# Patient Record
Sex: Male | Born: 1990 | Race: Black or African American | Hispanic: No | Marital: Single | Smoking: Never smoker
Health system: Southern US, Community
[De-identification: ages and names within clinical notes are randomized; demographics above are authoritative.]

## PROBLEM LIST (undated history)

## (undated) DIAGNOSIS — R569 Unspecified convulsions: Secondary | ICD-10-CM

## (undated) DIAGNOSIS — Z9911 Dependence on respirator [ventilator] status: Secondary | ICD-10-CM

## (undated) DIAGNOSIS — G9340 Encephalopathy, unspecified: Secondary | ICD-10-CM

## (undated) HISTORY — PX: GASTROSTOMY: SHX151

## (undated) HISTORY — PX: TRACHEOSTOMY: SUR1362

---
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
## 2011-05-05 LAB — POCT I-STAT 3, ART BLOOD GAS (G3+)
Acid-Base Excess: 4 — ABNORMAL HIGH
O2 Saturation: 99
Operator id: 244851
Patient temperature: 98.6

## 2011-05-05 LAB — I-STAT 8, (EC8 V) (CONVERTED LAB)
Bicarbonate: 25.3 — ABNORMAL HIGH
Glucose, Bld: 89
TCO2: 27
pH, Ven: 7.357 — ABNORMAL HIGH

## 2011-05-05 LAB — CBC
HCT: 36.7
MCV: 79.6
Platelets: 167
RDW: 14

## 2011-05-05 LAB — GRAM STAIN

## 2011-05-05 LAB — DIFFERENTIAL
Basophils Absolute: 0
Basophils Relative: 0
Eosinophils Absolute: 0.2
Eosinophils Relative: 6 — ABNORMAL HIGH

## 2011-05-05 LAB — INFLUENZA A+B VIRUS AG-DIRECT(RAPID)

## 2011-05-05 LAB — POCT I-STAT CREATININE: Operator id: 295021

## 2011-05-05 LAB — CULTURE, RESPIRATORY W GRAM STAIN

## 2011-05-08 LAB — COMPREHENSIVE METABOLIC PANEL
Albumin: 3.5
BUN: 7
CO2: 26
Chloride: 102
Creatinine, Ser: <0.3 — ABNORMAL LOW
Glucose, Bld: 100 — ABNORMAL HIGH
Total Bilirubin: 0.4

## 2011-05-08 LAB — CBC
HCT: 36.3
Hemoglobin: 12
RDW: 14.7

## 2011-05-08 LAB — TSH: TSH: 2.547

## 2011-05-08 LAB — DIFFERENTIAL
Basophils Absolute: 0
Eosinophils Relative: 6 — ABNORMAL HIGH
Lymphocytes Relative: 25 — ABNORMAL LOW
Monocytes Absolute: 0.6

## 2011-05-08 LAB — GRAM STAIN

## 2011-05-08 LAB — POCT I-STAT EG7
Calcium, Ion: 1.29
HCT: 36
Hemoglobin: 12.2
Operator id: 148741
Potassium: 3.7
Sodium: 135
pH, Ven: 7.387 — ABNORMAL HIGH

## 2011-05-08 LAB — CULTURE, RESPIRATORY W GRAM STAIN

## 2011-05-12 LAB — URINALYSIS, ROUTINE W REFLEX MICROSCOPIC
Glucose, UA: NEGATIVE
Hgb urine dipstick: NEGATIVE
Ketones, ur: NEGATIVE
Protein, ur: NEGATIVE

## 2011-05-12 LAB — CALCIUM: Calcium: 9.3

## 2011-05-12 LAB — CBC
Hemoglobin: 13.4
RBC: 5.1
WBC: 4.7

## 2011-05-12 LAB — URINE CULTURE: Culture: NO GROWTH

## 2011-05-12 LAB — PHOSPHORUS: Phosphorus: 4.7 — ABNORMAL HIGH

## 2011-05-12 LAB — COMPREHENSIVE METABOLIC PANEL
AST: 36
Alkaline Phosphatase: 199
BUN: 5 — ABNORMAL LOW
CO2: 24
Chloride: 101
Creatinine, Ser: <0.3 — ABNORMAL LOW
Potassium: 4.2
Total Bilirubin: 0.6

## 2011-05-12 LAB — CULTURE, BLOOD (ROUTINE X 2)

## 2011-05-12 LAB — BASIC METABOLIC PANEL
Potassium: 4.3
Sodium: 134 — ABNORMAL LOW

## 2011-05-12 LAB — PHENYTOIN LEVEL, TOTAL: Phenytoin Lvl: 12.4

## 2011-05-12 LAB — T4: T4, Total: 6.6

## 2011-05-12 LAB — MAGNESIUM: Magnesium: 2.4

## 2011-05-19 LAB — DIFFERENTIAL
Basophils Absolute: 0 10*3/uL (ref 0.0–0.1)
Eosinophils Absolute: 0.1 10*3/uL (ref 0.0–1.2)
Eosinophils Relative: 5 % (ref 0–5)
Monocytes Absolute: 0.4 10*3/uL (ref 0.2–1.2)
Neutrophils Relative %: 48 % (ref 43–71)

## 2011-05-19 LAB — CULTURE, RESPIRATORY W GRAM STAIN

## 2011-05-19 LAB — COMPREHENSIVE METABOLIC PANEL
ALT: 53 U/L (ref 0–53)
CO2: 21 mEq/L (ref 19–32)
Calcium: 9.1 mg/dL (ref 8.4–10.5)
Glucose, Bld: 111 mg/dL — ABNORMAL HIGH (ref 70–99)
Sodium: 135 mEq/L (ref 135–145)

## 2011-05-19 LAB — CULTURE, BLOOD (ROUTINE X 2)

## 2011-05-19 LAB — URINALYSIS, ROUTINE W REFLEX MICROSCOPIC
Protein, ur: NEGATIVE mg/dL
Urobilinogen, UA: 0.2 mg/dL (ref 0.0–1.0)

## 2011-05-19 LAB — CBC
Hemoglobin: 12.1 g/dL (ref 12.0–16.0)
MCHC: 32.8 g/dL (ref 31.0–37.0)
MCV: 78.7 fL (ref 78.0–98.0)
RBC: 4.69 MIL/uL (ref 3.80–5.70)

## 2011-05-19 LAB — GRAM STAIN

## 2011-05-19 LAB — URINE CULTURE: Colony Count: 1000

## 2011-05-19 LAB — BUN: BUN: 7 mg/dL (ref 6–23)

## 2011-09-18 ENCOUNTER — Emergency Department (HOSPITAL_COMMUNITY)
Admission: EM | Admit: 2011-09-18 | Discharge: 2011-09-18 | Disposition: A | Discharge status: Home / Self Care | Payer: Medicaid Other | Attending: Emergency Medicine | Admitting: Emergency Medicine

## 2011-09-18 ENCOUNTER — Encounter (HOSPITAL_COMMUNITY): Payer: Self-pay | Admitting: *Deleted

## 2011-09-18 ENCOUNTER — Emergency Department (HOSPITAL_COMMUNITY): Payer: Medicaid Other

## 2011-09-18 DIAGNOSIS — R000 Tachycardia, unspecified: Secondary | ICD-10-CM | POA: Insufficient documentation

## 2011-09-18 DIAGNOSIS — Z930 Tracheostomy status: Secondary | ICD-10-CM | POA: Insufficient documentation

## 2011-09-18 DIAGNOSIS — J40 Bronchitis, not specified as acute or chronic: Secondary | ICD-10-CM | POA: Insufficient documentation

## 2011-09-18 DIAGNOSIS — Z9911 Dependence on respirator [ventilator] status: Secondary | ICD-10-CM | POA: Insufficient documentation

## 2011-09-18 DIAGNOSIS — R569 Unspecified convulsions: Secondary | ICD-10-CM | POA: Insufficient documentation

## 2011-09-18 HISTORY — DX: Dependence on respirator (ventilator) status: Z99.11

## 2011-09-18 HISTORY — DX: Unspecified convulsions: R56.9

## 2011-09-18 HISTORY — DX: Encephalopathy, unspecified: G93.40

## 2011-09-18 LAB — BASIC METABOLIC PANEL
CO2: 23 mEq/L (ref 19–32)
Chloride: 98 mEq/L (ref 96–112)
Creatinine, Ser: 0.24 mg/dL — ABNORMAL LOW (ref 0.50–1.35)
GFR calc Af Amer: >90 mL/min (ref >90)
Potassium: 3.6 mEq/L (ref 3.5–5.1)
Sodium: 133 mEq/L — ABNORMAL LOW (ref 135–145)

## 2011-09-18 LAB — CBC
MCHC: 33.2 g/dL (ref 30.0–36.0)
Platelets: 140 10*3/uL — ABNORMAL LOW (ref 150–400)
RDW: 14.3 % (ref 11.5–15.5)
WBC: 4.4 10*3/uL (ref 4.0–10.5)

## 2011-09-18 LAB — DIFFERENTIAL
Basophils Absolute: 0.1 10*3/uL (ref 0.0–0.1)
Basophils Relative: 1 % (ref 0–1)
Lymphocytes Relative: 16 % (ref 12–46)
Monocytes Absolute: 0.4 10*3/uL (ref 0.1–1.0)
Neutro Abs: 3.1 10*3/uL (ref 1.7–7.7)
Neutrophils Relative %: 72 % (ref 43–77)

## 2011-09-18 MED ORDER — MOXIFLOXACIN HCL 400 MG PO TABS: 400.0000 mg | ORAL_TABLET | Freq: Every day | ORAL | Status: AC

## 2011-09-18 NOTE — ED Notes (Signed)
Phlebotomy at bedside to draw blood.  

## 2011-09-18 NOTE — ED Notes (Signed)
Rx x 1, pt mother voiced understanding to f/u with PCP for continuing sx.  Voiced understanding to take abx as prescribed.

## 2011-09-18 NOTE — ED Provider Notes (Signed)
History     CSN: [id number]  Arrival date & time 09/18/11  1808   First MD Initiated Contact with Patient 09/18/11 2002      Chief Complaint  Patient presents with  . poss pneumonia     (Consider location/radiation/quality/duration/timing/severity/associated sxs/prior treatment) HPI Level V caveat as the patient is unable to speak. The history was provided by his caregiver. The last few days patient has been having increased secretions that are thicker and changed in color. He also has been requiring increasing oxygen. Usually just on half a liter but he has been increased to 2 L. He also has been having a faster heart rate up into the 90s when usually he is in the 50s. The caregiver called to his pediatrician who suggested he come to the emergency room to get a chest x-ray. Past Medical History  Diagnosis Date  . Seizures   . Encephalopathy   . Ventilator dependent     Past Surgical History  Procedure Date  . Tracheostomy     History reviewed. No pertinent family history.  History  Substance Use Topics  . Smoking status: Never Smoker   . Smokeless tobacco: Not on file  . Alcohol Use: No      Review of Systems  Unable to perform ROS: Mental status change  Constitutional: Negative for fever.    Allergies  Reglan  Home Medications   Current Outpatient Rx  Name Route Sig Dispense Refill  . ACETAMINOPHEN 160 MG/5ML PO SOLN Oral Take 480 mg by mouth every 4 (four) hours as needed. For pain.    Marland Kitchen ALUM & MAG HYDROXIDE-SIMETH 200-200-20 MG/5ML PO SUSP Oral Take 30 mLs by mouth every 4 (four) hours as needed. For upset stomach    . DIAZEPAM 10 MG PO TABS Rectal Place 10 mg rectally as needed. If seizure lasts longer than 15 minutes.    Marland Kitchen FLUTICASONE PROPIONATE  HFA 220 MCG/ACT IN AERO Inhalation Inhale 3 puffs into the lungs 2 (two) times daily.    Marland Kitchen GLYCOPYRROLATE 1 MG PO TABS Oral Take 2 mg by mouth 3 (three) times daily as needed. For increased secretions.    .  IBUPROFEN 400 MG PO TABS Oral Take 400 mg by mouth every 6 (six) hours as needed. For pain    . BABY SHAMPOO EX Both Eyes Place 1 drop into both eyes daily. Prior to eye meds.    Marland Kitchen LACTULOSE 10 GM/15ML PO SOLN Oral Take 5-10 mLs by mouth 3 (three) times daily as needed. For constipation.    Marland Kitchen LEVETIRACETAM 100 MG/ML PO SOLN Per Tube Place 300 mg into feeding tube 2 (two) times daily.    . ADULT MULTIVITAMIN W/MINERALS CH Oral Take 1 tablet by mouth daily.    Marland Kitchen MUPIROCIN 2 % EX OINT Topical Apply 1 application topically 4 (four) times daily as needed. For irritated skin    . POLYETHYLENE GLYCOL 3350 PO PACK Oral Take 17 g by mouth daily as needed. For constipation. If no stool for 3 days.    Marland Kitchen POLYETHYLENE GLYCOL 3350 PO PACK Oral Take 17 g by mouth every morning.      BP 130/81  Pulse 77  Temp(Src) 98 F (36.7 C) (Tympanic)  Resp 20  SpO2 97%  Physical Exam  Nursing note and vitals reviewed. Constitutional: No distress.  HENT:  Head: Normocephalic and atraumatic.  Right Ear: External ear normal.  Left Ear: External ear normal.  Eyes: Right eye exhibits no discharge. Left eye  exhibits no discharge. No scleral icterus.  Neck: Neck supple. No tracheal deviation present.       Tracheostomy  Cardiovascular: Normal rate, regular rhythm and intact distal pulses.   Pulmonary/Chest: Effort normal. No stridor. No respiratory distress. He has no wheezes. He has no rales.       Bilateral rhonchi  Abdominal: Soft. Bowel sounds are normal. He exhibits no distension. There is no tenderness. There is no rebound and no guarding.  Musculoskeletal: He exhibits no edema and no tenderness.       Atrophy noted bilateral upper extremities lower extremities  Neurological:       Patient unresponsive to verbal stimuli, eyes are open  Skin: Skin is warm and dry. No rash noted.  Psychiatric: He has a normal mood and affect.    ED Course  Procedures (including critical care time)  Labs Reviewed  CBC -  Abnormal; Notable for the following:    MCH 25.9 (*)    Platelets 140 (*)    All other components within normal limits  BASIC METABOLIC PANEL - Abnormal; Notable for the following:    Sodium 133 (*)    Creatinine, Ser 0.24 (*)    All other components within normal limits  DIFFERENTIAL   Dg Chest Port 1 View  09/18/2011  *RADIOLOGY REPORT*  Clinical Data: Increased secretions  PORTABLE CHEST - 1 VIEW  Comparison: 02/10/2009  Findings: Tracheostomy and right subclavian port catheter are in stable position.  There is a thoracolumbar dextroscoliosis with distortion of the thoracic cage as before.  Low lung volumes.  Some improvement in perihilar opacities since previous exam.  No effusion.  Heart size within normal limits.  IMPRESSION:  1.  Partial interval improvement in bilateral perihilar opacities. 2. Support hardware stable in position.  Original Report Authenticated By: Osa Craver, M.D.     1. Bronchitis       MDM  Patient without signs of definite pneumonia on his chest x-ray. There is no fever here and his laboratory tests are reassuring. However mom has noticed a change in the color of his secretions. We'll start him on a course of Avelox and have him followup with his primary Dr.       Celene Kras, MD 09/18/11 2221

## 2011-09-18 NOTE — ED Notes (Signed)
Pt mother brought pt in for increase in pulse and need for more oxygen through trach today.  States home nurse alerted her to this and that pt has also had increase in secretions today, notes they are "dirty" (brownish color).

## 2011-09-18 NOTE — ED Notes (Signed)
The pt  Has increased  Secretions in his trach and coughing   hes here to r/o pneumonia.

## 2011-09-19 NOTE — ED Notes (Signed)
Call from pharmacy [phone] Rx Avelox needs prior Auth can something else be prescribed?  Chart reviewed By Jaci Carrel PA change to "Levofloxacin 750 mg po once daily x 5 days."  Rx called to pharmacy.

## 2012-03-30 ENCOUNTER — Encounter (HOSPITAL_COMMUNITY): Payer: Self-pay

## 2012-03-30 ENCOUNTER — Inpatient Hospital Stay (HOSPITAL_COMMUNITY)
Admission: EM | Admit: 2012-03-30 | Discharge: 2012-03-31 | DRG: 100 | Disposition: A | Discharge status: Home / Self Care | Payer: Medicaid Other | Attending: Pediatric Critical Care Medicine | Admitting: Pediatric Critical Care Medicine

## 2012-03-30 DIAGNOSIS — Z930 Tracheostomy status: Secondary | ICD-10-CM

## 2012-03-30 DIAGNOSIS — Q079 Congenital malformation of nervous system, unspecified: Secondary | ICD-10-CM

## 2012-03-30 DIAGNOSIS — E039 Hypothyroidism, unspecified: Secondary | ICD-10-CM | POA: Diagnosis present

## 2012-03-30 DIAGNOSIS — Z9911 Dependence on respirator [ventilator] status: Secondary | ICD-10-CM

## 2012-03-30 DIAGNOSIS — G40901 Epilepsy, unspecified, not intractable, with status epilepticus: Secondary | ICD-10-CM | POA: Diagnosis present

## 2012-03-30 DIAGNOSIS — J9610 Chronic respiratory failure, unspecified whether with hypoxia or hypercapnia: Secondary | ICD-10-CM | POA: Diagnosis present

## 2012-03-30 DIAGNOSIS — R6250 Unspecified lack of expected normal physiological development in childhood: Secondary | ICD-10-CM | POA: Diagnosis present

## 2012-03-30 DIAGNOSIS — R569 Unspecified convulsions: Secondary | ICD-10-CM

## 2012-03-30 DIAGNOSIS — Z931 Gastrostomy status: Secondary | ICD-10-CM

## 2012-03-30 DIAGNOSIS — J45909 Unspecified asthma, uncomplicated: Secondary | ICD-10-CM | POA: Diagnosis present

## 2012-03-30 DIAGNOSIS — G40401 Other generalized epilepsy and epileptic syndromes, not intractable, with status epilepticus: Secondary | ICD-10-CM

## 2012-03-30 DIAGNOSIS — Z79899 Other long term (current) drug therapy: Secondary | ICD-10-CM

## 2012-03-30 DIAGNOSIS — G40909 Epilepsy, unspecified, not intractable, without status epilepticus: Principal | ICD-10-CM | POA: Diagnosis present

## 2012-03-30 DIAGNOSIS — G9349 Other encephalopathy: Secondary | ICD-10-CM

## 2012-03-30 LAB — CBC WITH DIFFERENTIAL/PLATELET
Basophils Absolute: 0.1 10*3/uL (ref 0.0–0.1)
HCT: 41 % (ref 39.0–52.0)
Lymphocytes Relative: 24 % (ref 12–46)
Lymphs Abs: 0.9 10*3/uL (ref 0.7–4.0)
MCV: 78.1 fL (ref 78.0–100.0)
Monocytes Absolute: 0.8 10*3/uL (ref 0.1–1.0)
Neutro Abs: 1.8 10*3/uL (ref 1.7–7.7)
RBC: 5.25 MIL/uL (ref 4.22–5.81)
RDW: 14.2 % (ref 11.5–15.5)
WBC: 3.5 10*3/uL — ABNORMAL LOW (ref 4.0–10.5)

## 2012-03-30 LAB — BASIC METABOLIC PANEL
CO2: 25 mEq/L (ref 19–32)
Chloride: 102 mEq/L (ref 96–112)
Creatinine, Ser: 0.34 mg/dL — ABNORMAL LOW (ref 0.50–1.35)
Glucose, Bld: 86 mg/dL (ref 70–99)
Sodium: 139 mEq/L (ref 135–145)

## 2012-03-30 LAB — PHENYTOIN LEVEL, TOTAL: Phenytoin Lvl: 10.8 ug/mL (ref 10.0–20.0)

## 2012-03-30 MED ORDER — PHENYTOIN SODIUM EXTENDED 30 MG PO CAPS
60.0000 mg | ORAL_CAPSULE | Freq: Every day | ORAL | Status: DC
  Administered 2012-03-30 – 2012-03-31 (×2): 60 mg via ORAL
  Filled 2012-03-30 (×2): qty 2

## 2012-03-30 MED ORDER — PHENYTOIN SODIUM EXTENDED 30 MG PO CAPS
60.0000 mg | ORAL_CAPSULE | Freq: Every day | ORAL | Status: DC
  Administered 2012-03-31: 60 mg via ORAL
  Filled 2012-03-30 (×3): qty 2

## 2012-03-30 MED ORDER — CLONAZEPAM 0.1 MG/ML ORAL SUSPENSION
0.2500 mg | Freq: Every day | ORAL | Status: DC
  Administered 2012-03-31: 0.25 mg
  Filled 2012-03-30: qty 2

## 2012-03-30 MED ORDER — POLYETHYLENE GLYCOL 3350 17 G PO PACK
17.0000 g | PACK | Freq: Every day | ORAL | Status: DC
  Administered 2012-03-31: 17 g via ORAL
  Filled 2012-03-30 (×2): qty 1

## 2012-03-30 MED ORDER — FREE WATER
30.0000 mL | Freq: Once | Status: AC
  Administered 2012-03-30: 30 mL

## 2012-03-30 MED ORDER — PHENYTOIN SODIUM EXTENDED 30 MG PO CAPS: 30.0000 mg | ORAL_CAPSULE | Freq: Every day | ORAL | Status: DC

## 2012-03-30 MED ORDER — BISACODYL 10 MG RE SUPP: 10.0000 mg | Freq: Every day | RECTAL | Status: DC | PRN

## 2012-03-30 MED ORDER — MUPIROCIN 2 % EX OINT: TOPICAL_OINTMENT | Freq: Four times a day (QID) | CUTANEOUS | Status: DC | PRN

## 2012-03-30 MED ORDER — TOPIRAMATE 100 MG PO TABS
100.0000 mg | ORAL_TABLET | Freq: Two times a day (BID) | ORAL | Status: DC
  Administered 2012-03-30 – 2012-03-31 (×3): 100 mg via ORAL
  Filled 2012-03-30 (×5): qty 1

## 2012-03-30 MED ORDER — SODIUM CHLORIDE 0.9 % IV SOLN
INTRAVENOUS | Status: DC
  Administered 2012-03-30: 12:00:00 via INTRAVENOUS

## 2012-03-30 MED ORDER — SODIUM CHLORIDE 0.9 % IJ SOLN: 30.0000 mL | Freq: Two times a day (BID) | INTRAMUSCULAR | Status: DC

## 2012-03-30 MED ORDER — GLYCOPYRROLATE 1 MG PO TABS
2.0000 mg | ORAL_TABLET | Freq: Three times a day (TID) | ORAL | Status: DC | PRN
  Filled 2012-03-30: qty 2

## 2012-03-30 MED ORDER — LEVOTHYROXINE SODIUM 75 MCG PO TABS
75.0000 ug | ORAL_TABLET | Freq: Every day | ORAL | Status: DC
  Administered 2012-03-31: 75 ug via ORAL
  Filled 2012-03-30 (×2): qty 1

## 2012-03-30 MED ORDER — POLYETHYLENE GLYCOL 3350 17 G PO PACK: 17.0000 g | PACK | Freq: Two times a day (BID) | ORAL | Status: DC | PRN

## 2012-03-30 MED ORDER — ADULT MULTIVITAMIN LIQUID CH
5.0000 mL | Freq: Every day | ORAL | Status: DC
  Administered 2012-03-30 – 2012-03-31 (×2): 5 mL
  Filled 2012-03-30 (×4): qty 5

## 2012-03-30 MED ORDER — FLUTICASONE PROPIONATE HFA 220 MCG/ACT IN AERO
3.0000 | INHALATION_SPRAY | Freq: Two times a day (BID) | RESPIRATORY_TRACT | Status: DC
  Administered 2012-03-30 – 2012-03-31 (×2): 3 via RESPIRATORY_TRACT
  Filled 2012-03-30 (×2): qty 12

## 2012-03-30 MED ORDER — SODIUM CHLORIDE 0.9 % IV SOLN
15.0000 mg/kg | Freq: Once | INTRAVENOUS | Status: AC
  Administered 2012-03-30: 885 mg via INTRAVENOUS
  Filled 2012-03-30: qty 17.7

## 2012-03-30 MED ORDER — JEVITY 1.2 CAL PO LIQD
150.0000 mL | Freq: Every day | ORAL | Status: DC
  Filled 2012-03-30 (×2): qty 237

## 2012-03-30 MED ORDER — CLONAZEPAM 0.1 MG/ML ORAL SUSPENSION
0.5000 mg | Freq: Every day | ORAL | Status: DC
  Administered 2012-03-30: 0.5 mg
  Filled 2012-03-30: qty 5
  Filled 2012-03-30: qty 1
  Filled 2012-03-30 (×2): qty 5

## 2012-03-30 MED ORDER — ALBUTEROL SULFATE HFA 108 (90 BASE) MCG/ACT IN AERS
2.0000 | INHALATION_SPRAY | RESPIRATORY_TRACT | Status: DC | PRN
  Administered 2012-03-31: 2 via RESPIRATORY_TRACT
  Filled 2012-03-30: qty 6.7

## 2012-03-30 MED ORDER — PHENYTOIN SODIUM EXTENDED 30 MG PO CAPS
60.0000 mg | ORAL_CAPSULE | Freq: Every day | ORAL | Status: DC
  Administered 2012-03-30: 60 mg via ORAL
  Filled 2012-03-30 (×2): qty 2

## 2012-03-30 MED ORDER — LEVETIRACETAM 100 MG/ML PO SOLN
300.0000 mg | Freq: Two times a day (BID) | ORAL | Status: DC
  Administered 2012-03-30 – 2012-03-31 (×3): 300 mg
  Filled 2012-03-30 (×5): qty 5

## 2012-03-30 MED ORDER — SODIUM CHLORIDE 0.9 % IJ SOLN: 10.0000 mL | Freq: Two times a day (BID) | INTRAMUSCULAR | Status: DC

## 2012-03-30 MED ORDER — LACTULOSE 20 GM/30ML PO SOLN
5.0000 mL | Freq: Three times a day (TID) | ORAL | Status: DC | PRN
  Filled 2012-03-30 (×2): qty 5

## 2012-03-30 MED ORDER — ALUM & MAG HYDROXIDE-SIMETH 200-200-20 MG/5ML PO SUSP
30.0000 mL | ORAL | Status: DC | PRN
  Filled 2012-03-30: qty 30

## 2012-03-30 MED ORDER — LACTULOSE 10 GM/15ML PO SOLN
3.3330 g | Freq: Three times a day (TID) | ORAL | Status: DC | PRN
  Filled 2012-03-30: qty 15

## 2012-03-30 MED ORDER — LACTULOSE 10 GM/15ML PO SOLN
3.3000 g | Freq: Three times a day (TID) | ORAL | Status: DC | PRN
Start: 2012-03-30 — End: 2012-03-30
  Filled 2012-03-30: qty 15

## 2012-03-30 MED ORDER — HYDROCERIN EX CREA
TOPICAL_CREAM | Freq: Every day | CUTANEOUS | Status: DC | PRN
  Filled 2012-03-30: qty 113

## 2012-03-30 MED ORDER — FREE WATER
30.0000 mL | Freq: Two times a day (BID) | Status: DC
  Administered 2012-03-31: 30 mL

## 2012-03-30 MED ORDER — CHLORHEXIDINE GLUCONATE 0.12 % MT SOLN
15.0000 mL | Freq: Three times a day (TID) | OROMUCOSAL | Status: DC
  Administered 2012-03-30 – 2012-03-31 (×3): 15 mL via OROMUCOSAL
  Filled 2012-03-30 (×6): qty 15

## 2012-03-30 MED ORDER — JEVITY 1.2 CAL PO LIQD
150.0000 mL | Freq: Every day | ORAL | Status: DC
  Administered 2012-03-30 – 2012-03-31 (×6): 150 mL
  Filled 2012-03-30 (×10): qty 237

## 2012-03-30 MED ORDER — CETIRIZINE HCL 5 MG/5ML PO SYRP
10.0000 mg | ORAL_SOLUTION | Freq: Every day | ORAL | Status: DC
  Administered 2012-03-30: 10 mg
  Filled 2012-03-30 (×2): qty 10

## 2012-03-30 MED ORDER — ARTIFICIAL TEARS OP OINT
TOPICAL_OINTMENT | Freq: Three times a day (TID) | OPHTHALMIC | Status: DC
  Administered 2012-03-30 (×2): via OPHTHALMIC
  Administered 2012-03-31 (×2): 1 via OPHTHALMIC
  Filled 2012-03-30 (×2): qty 3.5

## 2012-03-30 MED ORDER — MUPIROCIN 2 % EX OINT
TOPICAL_OINTMENT | Freq: Two times a day (BID) | CUTANEOUS | Status: DC
Start: 2012-03-30 — End: 2012-03-31
  Administered 2012-03-30 (×2): via TOPICAL
  Administered 2012-03-31: 1 via TOPICAL
  Filled 2012-03-30: qty 22

## 2012-03-30 NOTE — ED Notes (Signed)
Pt here for increase in seizures over the last few weeks, since changes made in medications, sts several seizures today. One witnessed on arrival here.

## 2012-03-30 NOTE — ED Notes (Signed)
Family at bedside. 

## 2012-03-30 NOTE — H&P (Signed)
[id number]  HPI: Dorell is a 21 yo M with complex PMH, including encephalopathy, seizure disorder, s/p G-tube and trach, vent-dependent, admitted for increased seizure activity.  Per mother, approximately 3 weeks ago, pt's Dilantin dose was decreased given an elevated level. The Dilantin dose was decreased from 60 mg qAM, 60 mg q noon, 90 mg qHS to dose of 30 mg qAM, 60 mg qnoon, 30 mg qhs. Subsequently, mother noted increase in seizure activity. Dilantin level was 19 last week, then 15 this week per mother. On Thursday (8/15), Dilantin dose increased to 60 mg TID as directed by neurology. No missed doses per mother. During the past 24 hrs, mother notes that pt has had numerous seizures. She gave Diastat yesterday at 10 AM and midnight, which only temporarily decreased seizure activity. Pt's seizures include upper and lower extremity jerking, facial tremors, and tongue thrusting. At baseline, pt rarely seizes.   This AM, pt presented to [facility] ER this AM by EMS. Given phosphenytoin 15 mg/kg load in ER. Last seizure activity was at 0800 today (8/17).   No fevers, runny nose, congestion, increased secretions, emesis, diarrhea, rash, or other associated sxs. No changes in vent settings at home. No new exposures or other new medications.  PMH:  Seizure disorder. Followed by [facility]- Dr. Sharene Skeans.  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 Authorizing Provider  albuterol (PROVENTIL HFA;VENTOLIN HFA) 108 (90 BASE) MCG/ACT inhaler Inhale 2 puffs into the lungs every 4 (four) hours as needed. For wheezing/congestion.   Yes Historical Provider, MD  Artificial Tear Ointment OINT Place 1 application into both eyes 3 (three) times daily.   Yes Historical Provider, MD  Cetirizine HCl (ZYRTEC) 5 MG/5ML SYRP Place  10 mg into feeding tube at bedtime.   Yes Historical Provider, MD  cholecalciferol (VITAMIN D) 1000 UNITS tablet Place 2,000 Units into feeding tube 2 (two) times daily.   Yes Historical Provider, MD  clonazePAM (KLONOPIN) 0.1 mg/mL SUSP Place 0.25-0.5 mg into feeding tube 2 (two) times daily. Take 0.25mg  in the morning and 0.5mg  in the evening   Yes Historical Provider, MD  diazepam (DIASTAT ACUDIAL) 10 MG GEL Place 10 mg rectally once as needed. For seizure lasting longer than   Yes Historical Provider, MD  fluticasone (FLOVENT HFA) 220 MCG/ACT inhaler Inhale 3 puffs into the lungs 2 (two) times daily.   Yes Historical Provider, MD  glycopyrrolate (ROBINUL) 1 MG tablet Place 2 mg into feeding tube 3 (three) times daily as needed. For increased secretions.   Yes Historical Provider, MD  Infant Care Products (BABY SHAMPOO EX) Place 1 drop into both eyes daily. Prior to eye meds.   Yes Historical Provider, MD  levETIRAcetam (KEPPRA) 100 MG/ML solution Place 300 mg into feeding tube 2 (two) times daily.   Yes Historical Provider, MD  levothyroxine (SYNTHROID, LEVOTHROID) 25 MCG tablet Place 75 mcg into feeding tube daily.   Yes Historical Provider, MD  lidocaine (XYLOCAINE) 2 % jelly Apply 1 application topically as directed. Apply 30 minutes prior to port access   Yes Historical Provider, MD  Multiple Vitamins-Minerals (MULTI COMPLETE/IRON PO) Place 1 tablet into feeding tube daily.   Yes Historical Provider, MD  mupirocin ointment (BACTROBAN) 2 % Apply 1 application topically 2 (two) times daily. Topical to trach and GT stomas   Yes Historical Provider, MD  Nutritional Supplements (FEEDING SUPPLEMENT, JEVITY 1.2,) LIQD Place 150 mLs into feeding tube 5 (five) times daily.   Yes Historical Provider, MD  OVER THE COUNTER MEDICATION Place 2.5 mLs into feeding tube 2 (two) times daily. Table Salt   Yes Historical Provider, MD  OVER THE COUNTER MEDICATION Place 30 mLs into feeding tube See admin  instructions. Sodium Bacitrate - Mix with H2O and give with table salt.  * Do not mix in feeding bag   Yes Historical Provider, MD  phenytoin (DILANTIN) 30 MG ER capsule Place 60 mg into feeding tube every 8 (eight) hours. Give at 8am, 4pm and midnight   Yes Historical Provider, MD  polyethylene glycol (MIRALAX / GLYCOLAX) packet Place 17 g into feeding tube daily as needed. For constipation. If no stool for 3 days.   Yes Historical Provider, MD  polyethylene glycol (MIRALAX / GLYCOLAX) packet Place 17 g into feeding tube every morning.    Yes Historical Provider, MD  simethicone (MYLICON) 40 MG/0.6ML drops Take 80 mg by mouth every 6 (six) hours as needed. For gas and / or discomfort   Yes Historical Provider, MD  Skin Protectants, Misc. (EUCERIN) cream Apply 1 application topically as needed. For dry skin.   Yes Historical Provider, MD  topiramate (TOPAMAX) 100 MG tablet Place 100 mg into feeding tube 2 (two) times daily.   Yes Historical Provider, MD  acetaminophen (TYLENOL) 160 MG/5ML solution Place 480 mg into feeding tube every 4 (four) hours as needed. For pain.    Historical Provider, MD  alum & mag hydroxide-simeth (MAALOX/MYLANTA) 200-200-20 MG/5ML suspension Place 30 mLs into feeding tube every 4 (four) hours as needed. For upset stomach    Historical Provider, MD  bisacodyl (DULCOLAX) 10 MG suppository Place 10 mg rectally See admin instructions. Every other day As needed for constipation.    Historical Provider, MD  Heparin Lock Flush (HEPARIN, PORCINE, LOCK FLUSH) 100 UNIT/ML SOLN Inject 8 mLs into the vein every 14 (fourteen) days.    Historical Provider, MD  ibuprofen (ADVIL,MOTRIN) 400 MG tablet Place 400 mg into feeding tube every 6 (six) hours as needed. For pain    Historical Provider, MD  lactulose (CHRONULAC) 10 GM/15ML solution Place 5-10 mLs into feeding tube 3 (three) times daily as needed. For constipation.    Historical Provider, MD  Sodium Chloride Flush (NORMAL SALINE FLUSH  IV) Inject 8 mLs into the vein every 14 (fourteen) days.    Historical Provider, MD    ALL: Reglan (causes seizures)  FHx: Mother- HTN, Siblings healthy - paternal fhx unknown per mother  SHx: Lives with mother, father, and 3 sisters in [place]. No tobacco smoke exposure at home. Received nursing care daily and nightly from [place].  PE: BP 89/49  Pulse 76  Temp 97.2 F (36.2 C) (Axillary)  Resp 12  Ht 5\' 5"  (1.651 m)  Wt 58.968 kg (130 lb)  BMI 21.63 kg/m2  SpO2 99% GEN: AAM with developmental delay, nonverbal, lying in bed. NAD. HEENT: PERRL. Sclerae clear. Nares patent w/o discharge. O/P clear. MMM. Trach in place-- site c/d/i. NECK: No cervical LAD. CV: RRR. Nl S1, S2. No murmurs. Pulses 2+ distal (radial, DP). CR brisk. PULM: CTAB on home vent. ABD: +BS. S, NTND. Gtube site c/d/i. EXTR: WWP SKIN: No lesions/breakdown/rash. GU: Deferred NEURO: Alert, eyes open but nonverbal. Does not follow commands.  LABS: CBC    Component Value Date/Time   WBC 3.5* 03/30/2012 0859   RBC 5.25 03/30/2012 0859  HGB 14.0 03/30/2012 0859   HCT 41.0 03/30/2012 0859   PLT 181 03/30/2012 0859   MCV 78.1 03/30/2012 0859   MCH 26.7 03/30/2012 0859   MCHC 34.1 03/30/2012 0859   RDW 14.2 03/30/2012 0859   LYMPHSABS 0.9 03/30/2012 0859   MONOABS 0.8 03/30/2012 0859   EOSABS 0.1 03/30/2012 0859   BASOSABS 0.1 03/30/2012 0859    BMET    Component Value Date/Time   NA 139 03/30/2012 0859   K 3.6 03/30/2012 0859   CL 102 03/30/2012 0859   CO2 25 03/30/2012 0859   GLUCOSE 86 03/30/2012 0859   BUN 11 03/30/2012 0859   CREATININE 0.34* 03/30/2012 0859   CALCIUM 10.2 03/30/2012 0859   GFRNONAA >90 03/30/2012 0859   GFRAA >90 03/30/2012 0859   Phenytoin level 10.8  A/P: Kieon is a 21 year old AA young man with seizure disorder, encephalopathy, who is G-tube, trach, and vent dependent admitted for increased seizure frequency. Likely related to Dilantin dosing and low levels.  NEURO: s/p phosphenytoin  load in ER today (8/17) -Discussed plan with Peds Neuro (Dr. Keturah Shavers) -Continue Dilantin dose of 60 mg TID -Continue home clonazepam, Keppra, and Topamax -Obtain phenytoin level tomorrow AM (8/18) -Monitor for seizure activity  FEN/GI: -Home Gtube feeds -KVO portacath -Home meds for bowel regimen, vitamins  ACCESS: -Gtube, trach, port-a-cath -No blood draws from port-a-cath per home nursing  CV/RESP: Stable on home vent -Continue home vent settings -Continue home meds (cetirizine, fluticasone, albuterol prn) -CR monitoring  ENDO: -Continue home Synthroid  DISPO/SOCIAL: -PICU status given respiratory status, vent-dependence -Plan discussed with mother at bedside

## 2012-03-30 NOTE — ED Provider Notes (Signed)
History     CSN: [id number]  Arrival date & time 03/30/12  7829   First MD Initiated Contact with Patient 03/30/12 [phone]      Chief Complaint  Patient presents with  . Seizures    (Consider location/radiation/quality/duration/timing/severity/associated sxs/prior treatment) HPI Comments: Patient with a history of encephalopathy and seizure disorder presents by EMS with his guardian with complaints of increased seizure activity. He is ventilator dependent and nonverbal. He has a history of seizure disorder for which she takes Dilantin Keppra and Topamax. Approximately 3 weeks ago he had a slightly elevated Dilantin level and he had decreased his Dilantin from 60 mg in the morning, 60 mg at noon, and 90 mg at night to 30 mg in the morning 60 mg at midday and 30 mg at night. Since they decreased his Dilantin level of the caregivers noted an increase in seizure activity. He has a combination of myoclonic jerks in his upper extremities and sometimes some facial tremors. He rarely has generalized tonic-clonic seizures. In the last 2-3 days he's had numerous seizures. This morning his seizures had progressed involving both of his upper extremities. They have recently started increasing his Dilantin level back up to 60 mg 3 times a day. His mom had given a dose of Diastat 10 mg at 12 midnight last night. There's been no other recent change in behavior. No recent fevers, cough, congestion, vomiting, rash.  The history is provided by a parent. The history is limited by the condition of the patient.    Past Medical History  Diagnosis Date  . Seizures   . Encephalopathy   . Ventilator dependent     Past Surgical History  Procedure Date  . Tracheostomy     No family history on file.  History  Substance Use Topics  . Smoking status: Never Smoker   . Smokeless tobacco: Not on file  . Alcohol Use: No      Review of Systems  Unable to perform ROS   Allergies  Reglan  Home Medications     Current Outpatient Rx  Name Route Sig Dispense Refill  . ALBUTEROL SULFATE HFA 108 (90 BASE) MCG/ACT IN AERS Inhalation Inhale 2 puffs into the lungs every 4 (four) hours as needed. For wheezing/congestion.    . ARTIFICIAL TEAR OINTMENT OP OINT Both Eyes Place 1 application into both eyes 3 (three) times daily.    Marland Kitchen CETIRIZINE HCL 5 MG/5ML PO SYRP Per Tube Place 10 mg into feeding tube at bedtime.    Marland Kitchen VITAMIN D 1000 UNITS PO TABS Per Tube Place 2,000 Units into feeding tube 2 (two) times daily.    Marland Kitchen CLONAZEPAM 0.1 MG/ML ORAL SUSPENSION Per Tube Place 0.25-0.5 mg into feeding tube 2 (two) times daily. Take 0.25mg  in the morning and 0.5mg  in the evening    . DIAZEPAM 10 MG RE GEL Rectal Place 10 mg rectally once as needed. For seizure lasting longer than    . FLUTICASONE PROPIONATE  HFA 220 MCG/ACT IN AERO Inhalation Inhale 3 puffs into the lungs 2 (two) times daily.    Marland Kitchen GLYCOPYRROLATE 1 MG PO TABS Per Tube Place 2 mg into feeding tube 3 (three) times daily as needed. For increased secretions.    Marland Kitchen BABY SHAMPOO EX Both Eyes Place 1 drop into both eyes daily. Prior to eye meds.    Marland Kitchen LEVETIRACETAM 100 MG/ML PO SOLN Per Tube Place 300 mg into feeding tube 2 (two) times daily.    Marland Kitchen LEVOTHYROXINE  SODIUM 25 MCG PO TABS Per Tube Place 75 mcg into feeding tube daily.    Marland Kitchen LIDOCAINE HCL 2 % EX GEL Topical Apply 1 application topically as directed. Apply 30 minutes prior to port access    . MULTI COMPLETE/IRON PO Per Tube Place 1 tablet into feeding tube daily.    Marland Kitchen MUPIROCIN 2 % EX OINT Topical Apply 1 application topically 2 (two) times daily. Topical to trach and GT stomas    . JEVITY 1.2 CAL PO LIQD Per Tube Place 150 mLs into feeding tube 5 (five) times daily.    Marland Kitchen OVER THE COUNTER MEDICATION Per Tube Place 2.5 mLs into feeding tube 2 (two) times daily. Table Salt    . OVER THE COUNTER MEDICATION Per Tube Place 30 mLs into feeding tube See admin instructions. Sodium Bacitrate - Mix with H2O  and give with table salt.  * Do not mix in feeding bag    . PHENYTOIN SODIUM EXTENDED 30 MG PO CAPS Per Tube Place 60 mg into feeding tube every 8 (eight) hours. Give at 8am, 4pm and midnight    . POLYETHYLENE GLYCOL 3350 PO PACK Per Tube Place 17 g into feeding tube daily as needed. For constipation. If no stool for 3 days.    Marland Kitchen POLYETHYLENE GLYCOL 3350 PO PACK Per Tube Place 17 g into feeding tube every morning.     Marland Kitchen SIMETHICONE 40 MG/0.6ML PO SUSP Oral Take 80 mg by mouth every 6 (six) hours as needed. For gas and / or discomfort    . EUCERIN EX CREA Topical Apply 1 application topically as needed. For dry skin.    . TOPIRAMATE 100 MG PO TABS Per Tube Place 100 mg into feeding tube 2 (two) times daily.    . ACETAMINOPHEN 160 MG/5ML PO SOLN Per Tube Place 480 mg into feeding tube every 4 (four) hours as needed. For pain.    Marland Kitchen ALUM & MAG HYDROXIDE-SIMETH 200-200-20 MG/5ML PO SUSP Per Tube Place 30 mLs into feeding tube every 4 (four) hours as needed. For upset stomach    . BISACODYL 10 MG RE SUPP Rectal Place 10 mg rectally See admin instructions. Every other day As needed for constipation.    Marland Kitchen HEPARIN (PORCINE) LOCK FLUSH 100 UNIT/ML IV SOLN Intravenous Inject 8 mLs into the vein every 14 (fourteen) days.    . IBUPROFEN 400 MG PO TABS Per Tube Place 400 mg into feeding tube every 6 (six) hours as needed. For pain    . LACTULOSE 10 GM/15ML PO SOLN Per Tube Place 5-10 mLs into feeding tube 3 (three) times daily as needed. For constipation.    Marland Kitchen NORMAL SALINE FLUSH IV Intravenous Inject 8 mLs into the vein every 14 (fourteen) days.      BP 92/53  Pulse 82  Temp 98.9 F (37.2 C) (Axillary)  Resp 0  Ht 5\' 5"  (1.651 m)  Wt 130 lb (58.968 kg)  BMI 21.63 kg/m2  SpO2 97%  Physical Exam  Constitutional: He appears well-developed and well-nourished.  HENT:  Head: Normocephalic and atraumatic.  Mouth/Throat: Oropharynx is clear and moist.  Eyes: Pupils are equal, round, and reactive to light.   Neck: Normal range of motion. Neck supple.       Positive tracheostomy in place  Cardiovascular: Normal rate, regular rhythm and normal heart sounds.   Pulmonary/Chest: Effort normal and breath sounds normal. No respiratory distress. He has no wheezes. He has no rales. He exhibits no tenderness.  Abdominal:  Soft. Bowel sounds are normal. There is no tenderness. There is no rebound and no guarding.  Musculoskeletal: Normal range of motion. He exhibits no edema.  Lymphadenopathy:    He has no cervical adenopathy.  Neurological: He is alert.       Patient has eyes open but is nonverbal. Does not follow commands.  Skin: Skin is warm and dry. No rash noted.  Psychiatric: He has a normal mood and affect.    ED Course  Procedures (including critical care time)  Results for orders placed during the hospital encounter of 03/30/12  CBC WITH DIFFERENTIAL      Component Value Range   WBC 3.5 (*) 4.0 - 10.5 K/uL   RBC 5.25  4.22 - 5.81 MIL/uL   Hemoglobin 14.0  13.0 - 17.0 g/dL   HCT 81.1  91.4 - 78.2 %   MCV 78.1  78.0 - 100.0 fL   MCH 26.7  26.0 - 34.0 pg   MCHC 34.1  30.0 - 36.0 g/dL   RDW 95.6  21.3 - 08.6 %   Platelets 181  150 - 400 K/uL   Neutrophils Relative 50  43 - 77 %   Neutro Abs 1.8  1.7 - 7.7 K/uL   Lymphocytes Relative 24  12 - 46 %   Lymphs Abs 0.9  0.7 - 4.0 K/uL   Monocytes Relative 23 (*) 3 - 12 %   Monocytes Absolute 0.8  0.1 - 1.0 K/uL   Eosinophils Relative 1  0 - 5 %   Eosinophils Absolute 0.1  0.0 - 0.7 K/uL   Basophils Relative 1  0 - 1 %   Basophils Absolute 0.1  0.0 - 0.1 K/uL  BASIC METABOLIC PANEL      Component Value Range   Sodium 139  135 - 145 mEq/L   Potassium 3.6  3.5 - 5.1 mEq/L   Chloride 102  96 - 112 mEq/L   CO2 25  19 - 32 mEq/L   Glucose, Bld 86  70 - 99 mg/dL   BUN 11  6 - 23 mg/dL   Creatinine, Ser 5.78 (*) 0.50 - 1.35 mg/dL   Calcium 46.9  8.4 - 62.9 mg/dL   GFR calc non Af Amer >90  >90 mL/min   GFR calc Af Amer >90  >90 mL/min   PHENYTOIN LEVEL, TOTAL      Component Value Range   Phenytoin Lvl 10.8  10.0 - 20.0 ug/mL   No results found.   1. Seizures       MDM  Pt with increase in seizure activity, loaded with fosphenytoin.  Discussed with peds who has accepted pt to PICU per Dr. Fatima Sanger        Rolan Bucco, MD 03/30/12 1037

## 2012-03-30 NOTE — ED Notes (Signed)
Iv team paged for port access.  

## 2012-03-30 NOTE — ED Notes (Signed)
Pt resp reading zero, actural rate is 12, vent regulated

## 2012-03-30 NOTE — H&P (Signed)
PICU ATTND H&P  Detailed history above.  In short, Jeff Barry is a 20yo with a PMH static encephalopathy, seizures, chronic resp failure (trach and vent dependent) who presents with increasing seizures after attempted wean of his dilantin.  He was ratcheted back up on his dilantin but still was having worsening and worsening seizures of increasing frequencies.  He was given diastat twice yesterday with no impact on his seizures.  He was brought to the Adult ED this AM and loaded with dilantin and, only this, has slowed his seizures.  His mother feels he is otherwise well with no other signs/sx of infection or illness.  He is currently followed by pediatric services at [facility], so it was decided that he would be best served in the PICU.  PMH:  Seizure disorder. Followed by [facility]- Dr. Sharene Skeans.  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 EOM present but does not always track  NECK: No cervical LAD.  CV: RRR. Nl S1, S2. No murmurs. Pulses 2+ distal. CR brisk.  PULM: CTAB on home vent.  ABD: +BS, NTND. Gtube CDI EXTR: WWP  SKIN: Small area of break down on back of left heel NEURO: Alert, eyes open but nonverbal. Does not follow commands.  Labs reassuring.  Dilantin level low at ~11.  A/P: Kaydyn is a 21 year old AA young man with seizure disorder, encephalopathy, who is G-tube, trach, and vent dependent admitted for increased seizure frequency. Likely related to Dilantin dosing and low levels.  Will increase his dilantin per neuro, send a level tomorrow, and mini-load again with dilantin if levels are low.   If he remains seizure free, he likely can be discharged if home health available.  Will cont his other home meds, home vent, home resp regimen, and home nursing regimen.    Rebecca L. Katrinka Blazing, MD Pediatric Critical Care CC TIME: 60 min

## 2012-03-31 LAB — PHENYTOIN LEVEL, TOTAL: Phenytoin Lvl: 22 ug/mL — ABNORMAL HIGH (ref 10.0–20.0)

## 2012-03-31 MED ORDER — SODIUM CHLORIDE 0.9 % IV BOLUS (SEPSIS)
1000.0000 mL | Freq: Once | INTRAVENOUS | Status: AC
  Administered 2012-03-31: 1000 mL via INTRAVENOUS

## 2012-03-31 MED ORDER — HEPARIN SOD (PORK) LOCK FLUSH 100 UNIT/ML IV SOLN
500.0000 [IU] | INTRAVENOUS | Status: DC | PRN
  Administered 2012-03-31: 500 [IU]

## 2012-03-31 MED ORDER — HEPARIN SOD (PORK) LOCK FLUSH 100 UNIT/ML IV SOLN: 500.0000 [IU] | INTRAVENOUS | Status: DC

## 2012-03-31 NOTE — Progress Notes (Addendum)
S: Last seizure was ~0800 yesterday in ER prior to admission. No seizure activity since then.   Continued on home vent settings. Missed 2 feeds yesterday in AM. Given 1L NS bolus given poor UOP.   O:  Vitals: Temp:  [96.8 F (36 C)-98.9 F (37.2 C)] 97.2 F (36.2 C) (08/18 0600) Pulse Rate:  [61-92] 68  (08/18 0600) Resp:  [0-18] 13  (08/18 0600) BP: (81-97)/(43-61) 97/57 mmHg (08/18 0600) SpO2:  [90 %-100 %] 95 % (08/18 0600) FiO2 (%):  [21 %] 21 % (08/18 0600) Weight:  [58.968 kg (130 lb)] 58.968 kg (130 lb) (08/17 0844) PE: GEN: AAM with developmental delay, nonverbal, lying in bed. NAD.  HEENT: PERRL. Sclerae clear. Nares patent w/o discharge. O/P clear. MMM. Trach in place-- site c/d/i.  NECK: No cervical LAD.  CV: RRR. Nl S1, S2. No murmurs. Pulses 2+ distal (radial, DP). CR brisk.  PULM: CTAB on home vent.  ABD: +BS. S, NTND. Gtube site c/d/i.  EXTR: WWP  SKIN: No lesions/breakdown/rash.  GU: Deferred  NEURO: Alert, eyes open but nonverbal. Does not follow commands.  Phenytoin level 8/18 pending  I/O:   Intake/Output Summary (Last 24 hours) at 03/31/12 0744 Last data filed at 03/31/12 0600  Gross per 24 hour  Intake   2707 ml  Output     86 ml  Net   2621 ml    A/P: Jeff Barry is a 21 year old AA young man with seizure disorder, encephalopathy, who is G-tube, trach, and vent dependent admitted for increased seizure frequency due to Dilantin dosing and low levels.   NEURO: s/p phosphenytoin load in ER (8/17)  -Will touch base with Peds Neuro today -Continue Dilantin dose of 60 mg TID  -Continue home clonazepam, Keppra, Topamax  -F/u phenytoin level AM (8/18)  -Monitor for seizure activity   FEN/GI:  -Monitor Is/Os, UOP -Home Gtube feeds  -KVO portacath  -Home meds for bowel regimen, vitamins   ACCESS:  -Gtube, trach, port-a-cath  -No blood draws from port-a-cath per home nursing   CV/RESP: Stable on home vent  -Continue home vent settings  -Continue home  meds (cetirizine, fluticasone, albuterol prn)  -CR monitoring   ENDO:  -Continue home Synthroid   DISPO/SOCIAL:  -PICU status given respiratory status, vent-dependence. Will d/c home later today if can arrange home health nursing. -Plan discussed with mother at bedside  Pediatric Critical Care Attending Addendum:  Pt seen and reviewed with Drs. Sharen Hint and Hansel Feinstein this morning. I have reviewed Dr. Luan Moore note above and concur with her exam, assessment and plan.  I know Ali well from multiple previous ICU admissions, albeit none for the past two years. He has grown since last admission. He has not had any seizures since the dilantin load in the ED yesterday. His dilantin level today is 22, consistent with Dr. Darl Householder goal of 20-25. Wilho continues on all his home medications. His neuro exam is at baseline.  He developed some transient desats this morning after his chest vest vibratory therapy. He has remained on his home ventilator at unchanged settings. Sats are now mid 90s on 2 Lpm, ~28% oxygen (baseline). Lungs have coarse BSs bilaterally without wheezes, focal rhonchi or rales. Respiratory effort is in synch with vent.   Hemodynamically fine with normal cardiac exam. HR high 80s to 90s, BP fine, pulses and perfusion normal.  Abdomen benign with intact G-tube site. Lower abdomen slightly distended. Ultrasound estimate of >500 cc urine in bladder. Mother states  that he sometimes goes 24+ hours without voiding.  Imp/Plan:  1. Chronic, static encephalopathy of undetermined etiology. Stable.  2.  Break-through seizures (status epilepticus) temporally related to decreasing dilantin dose (210 mg/d to 120 mg/d). Now controlled post DPH load and increase in scheduled dosing as noted above. Dr. Devonne Doughty is following during this hospitalization. Continue other anti-convulsants clonazepam, Keppra, and Topamax.  3.  Chronic respiratory failure leading to night-time ventilator  dependence via tracheostomy. Stable on his home settings. Have continued vent support while in the PICU. No contraindication to resuming his usual regimen at home.  4.  Hypothyroidism, stable on thyroid replacement therapy.  5.  Asthma, stable on home regimen.  Plan D/C home today via ambulance or [facility]. Discussed plans with mother by phone and she concurs.  CC time:  45 minutes  Ludwig Clarks, MD Pediatric Critical Care

## 2012-03-31 NOTE — Progress Notes (Signed)
   CARE MANAGEMENT NOTE 03/31/2012  Patient:  Jeff Barry, Jeff Barry   Account Number:  [id number]  Date Initiated:  03/31/2012  Documentation initiated by:  [facility] [place]  Subjective/Objective Assessment:     Action/Plan:   lives at home with mother, has [facility] for [facility]   Anticipated DC Date:  03/31/2012   Anticipated DC Plan:  HOME W HOME HEALTH SERVICES      DC Planning Services  CM consult      [facility] Choice  HOME HEALTH  Resumption Of Svcs/PTA Provider   Choice offered to / List presented to:  [name]        HH arranged  HH-1 RN      [facility] agency  [facility]   Status of service:  Completed, signed off Medicare Important Message given?   (If response is "NO", the following Medicare IM given date fields will be blank) Date Medicare IM given:   Date Additional Medicare IM given:    Discharge Disposition:  HOME W HOME HEALTH SERVICES  Per UR Regulation:    If discussed at Long Length of Stay Meetings, dates discussed:    Comments:  03/31/2012 1230 [facility], [phone] for resumption of care. Received call back from [facility] with [facility]. Fax # [phone]. States they will have nurse go out today to follow up. Mother did call her to make her aware he was d/c today. Faxed orders, and H&P to [facility]. Isidoro Donning RN CCM Case Mgmt phone [phone]

## 2012-03-31 NOTE — Progress Notes (Signed)
My shift from 1900-0700 on 8/17 -no seizure activity noted for the 12 hours.  Pt rested well.  Temp around 36 the last 3 readings -resident notified.  Warm blankets applied and room temp adjusted warmer.  Pt has not voided since 1200 noon yesterday.  Bladder scanned >530/warm compresses applied /manual compression applied-resident aware.  NS bolus 1 liter given per orders.  No BM noted will given Miralax per orders.  Tolerated TF well through shift.  Turned every 2 hours and PRN with heels floated.  Mom called for update early this AM.   Will cont to monitor.  Mortimer Fries RN

## 2012-03-31 NOTE — Discharge Summary (Signed)
Pediatric Teaching Program  [address]  [place], [place] [address] Phone: [phone] Fax: [phone]  Patient Details  Name: Jeff Barry MRN: [id number] DOB: [birth year]  DISCHARGE SUMMARY    Dates of Hospitalization: 03/30/2012 to 03/31/2012  Reason for Hospitalization: increased seizure frequency Final Diagnoses:   Brief Hospital Course:  [name] is a 21 yo male with a h/o seizure disorder, static encephalopathy, hypothyroidism, developmental delay, gtube dependence, and chronic respiratory failure (trach and vent dependent) who was admitted due to increased seizure frequency at home, likely secondary to attempted wean of dilantin. His last seizure was at ~0800 on 8/17; this occurred in ED prior to PICU admission. After being loaded with fosphenytoin, Stillman had no additional seizures. His dilantin dose was changed to 60mg  TID, and he was continued on his other home antiepileptics of Keppra, Clonazepam, and Topamax. On admission his dilantin level was 10.8, and prior to discharge (following fosphenytoin load) his dilantin level on 8/18 was 22 which is within goal of 20-25 per neurology. [name] was continued on all of his other home medications during this hospitalization. He was also continued on his home ventilator settings, though did have transient desat following chest vest vibratory therapy. Of note, patient also had slightly low urine output during admission, though mother states it is common for patient to go long periods of time without voiding and that he did have urine output greater than baseline yesterday during seizure activity. [name] received his home gtube feeds and bowel regimen. Also was continued home Synthroid dose for hypothyroidism. Aside from seizure activity, patient otherwise remained stable throughout this hospitalization. He was without seizures for >24 hours prior to discharge and will follow closely with neurology to ensure proper dilantin dose is  established. Pt was otherwise well without signs/symptoms of infection.     Discharge Weight: 58.968 kg (130 lb)   Discharge Condition: Improved  Discharge Diet: Resume diet  Discharge Activity: Ad lib   Procedures/Operations: none Consultants: Pediatric Neurology  Discharge PE: BP 98/49  Pulse 88  Temp 97.7 F (36.5 C) (Axillary)  Resp 32  Ht 5\' 5"  (1.651 m)  Wt 58.968 kg (130 lb)  BMI 21.63 kg/m2  SpO2 94% Gen: adult-size male lying in bed, nonverbal, responds to noxious stimuli HEENT: PERRL, sclera clear, moves eyes but does not track, nares without d/c, MMM Neck: No cervical LAD.  CV: RRR. Nl S1, S2. No murmurs. Pulses 2+ distal. CR brisk.  PULM: trach in place, on home vent, CTAB ABD: soft/nt/nd, +BS, gtube site c/d/i EXT: WWP, cap refill 2-3 seconds SKIN: Small area of break down on back of left heel  NEURO: encephalopathic, does not follow commands   Discharge Medication List  Medication List  As of 03/31/2012  4:00 PM   TAKE these medications         acetaminophen 160 MG/5ML solution   Commonly known as: TYLENOL   Place 480 mg into feeding tube every 4 (four) hours as needed. For pain.      albuterol 108 (90 BASE) MCG/ACT inhaler   Commonly known as: PROVENTIL HFA;VENTOLIN HFA   Inhale 2 puffs into the lungs every 4 (four) hours as needed. For wheezing/congestion.      alum & mag hydroxide-simeth 200-200-20 MG/5ML suspension   Commonly known as: MAALOX/MYLANTA   Place 30 mLs into feeding tube every 4 (four) hours as needed. For upset stomach      Artificial Tear Ointment Oint   Place 1 application into both eyes 3 (  three) times daily.      BABY SHAMPOO EX   Place 1 drop into both eyes daily. Prior to eye meds.      bisacodyl 10 MG suppository   Commonly known as: DULCOLAX   Place 10 mg rectally See admin instructions. Every other day As needed for constipation.      Cetirizine HCl 5 MG/5ML Syrp   Commonly known as: Zyrtec   Place 10 mg into feeding  tube at bedtime.      cholecalciferol 1000 UNITS tablet   Commonly known as: VITAMIN D   Place 2,000 Units into feeding tube 2 (two) times daily.      clonazePAM 0.1 mg/mL Susp   Commonly known as: KLONOPIN   Place 0.25-0.5 mg into feeding tube 2 (two) times daily. Take 0.25mg  in the morning and 0.5mg  in the evening      diazepam 10 MG Gel   Commonly known as: DIASTAT ACUDIAL   Place 10 mg rectally once as needed. For seizure lasting longer than      eucerin cream   Apply 1 application topically as needed. For dry skin.      feeding supplement (JEVITY 1.2 CAL) Liqd   Place 150 mLs into feeding tube 5 (five) times daily.      fluticasone 220 MCG/ACT inhaler   Commonly known as: FLOVENT HFA   Inhale 3 puffs into the lungs 2 (two) times daily.      glycopyrrolate 1 MG tablet   Commonly known as: ROBINUL   Place 2 mg into feeding tube 3 (three) times daily as needed. For increased secretions.      Heparin (Porcine) Lock Flush 100 UNIT/ML Soln   Inject 8 mLs into the vein every 14 (fourteen) days.      ibuprofen 400 MG tablet   Commonly known as: ADVIL,MOTRIN   Place 400 mg into feeding tube every 6 (six) hours as needed. For pain      lactulose 10 GM/15ML solution   Commonly known as: CHRONULAC   Place 5-10 mLs into feeding tube 3 (three) times daily as needed. For constipation.      levETIRAcetam 100 MG/ML solution   Commonly known as: KEPPRA   Place 300 mg into feeding tube 2 (two) times daily.      levothyroxine 25 MCG tablet   Commonly known as: SYNTHROID, LEVOTHROID   Place 75 mcg into feeding tube daily.      lidocaine 2 % jelly   Commonly known as: XYLOCAINE   Apply 1 application topically as directed. Apply 30 minutes prior to port access      MULTI COMPLETE/IRON PO   Place 1 tablet into feeding tube daily.      mupirocin ointment 2 %   Commonly known as: BACTROBAN   Apply 1 application topically 2 (two) times daily. Topical to trach and GT stomas        NORMAL SALINE FLUSH IV   Inject 8 mLs into the vein every 14 (fourteen) days.      OVER THE COUNTER MEDICATION   Place 2.5 mLs into feeding tube 2 (two) times daily. Table Salt      OVER THE COUNTER MEDICATION   Place 30 mLs into feeding tube See admin instructions. Sodium Bacitrate - Mix with H2O and give with table salt.  * Do not mix in feeding bag      phenytoin 30 MG ER capsule   Commonly known as: DILANTIN   Place 60  mg into feeding tube every 8 (eight) hours. Give at 8am, 4pm and midnight      polyethylene glycol packet   Commonly known as: MIRALAX / GLYCOLAX   Place 17 g into feeding tube daily as needed. For constipation. If no stool for 3 days.      polyethylene glycol packet   Commonly known as: MIRALAX / GLYCOLAX   Place 17 g into feeding tube every morning.      simethicone 40 MG/0.6ML drops   Commonly known as: MYLICON   Take 80 mg by mouth every 6 (six) hours as needed. For gas and / or discomfort      topiramate 100 MG tablet   Commonly known as: TOPAMAX   Place 100 mg into feeding tube 2 (two) times daily.            Immunizations Given (date): none Pending Results: none    Results for orders placed during the hospital encounter of 03/30/12 (from the past 48 hour(s))  CBC WITH DIFFERENTIAL     Status: Abnormal   Collection Time   03/30/12  8:59 AM      Component Value Range Comment   WBC 3.5 (*) 4.0 - 10.5 K/uL    RBC 5.25  4.22 - 5.81 MIL/uL    Hemoglobin 14.0  13.0 - 17.0 g/dL    HCT 16.1  09.6 - 04.5 %    MCV 78.1  78.0 - 100.0 fL    MCH 26.7  26.0 - 34.0 pg    MCHC 34.1  30.0 - 36.0 g/dL    RDW 40.9  81.1 - 91.4 %    Platelets 181  150 - 400 K/uL PLATELET COUNT CONFIRMED BY SMEAR   Neutrophils Relative 50  43 - 77 %    Neutro Abs 1.8  1.7 - 7.7 K/uL    Lymphocytes Relative 24  12 - 46 %    Lymphs Abs 0.9  0.7 - 4.0 K/uL    Monocytes Relative 23 (*) 3 - 12 %    Monocytes Absolute 0.8  0.1 - 1.0 K/uL    Eosinophils Relative 1  0 - 5 %     Eosinophils Absolute 0.1  0.0 - 0.7 K/uL    Basophils Relative 1  0 - 1 %    Basophils Absolute 0.1  0.0 - 0.1 K/uL   BASIC METABOLIC PANEL     Status: Abnormal   Collection Time   03/30/12  8:59 AM      Component Value Range Comment   Sodium 139  135 - 145 mEq/L    Potassium 3.6  3.5 - 5.1 mEq/L    Chloride 102  96 - 112 mEq/L    CO2 25  19 - 32 mEq/L    Glucose, Bld 86  70 - 99 mg/dL    BUN 11  6 - 23 mg/dL    Creatinine, Ser 7.82 (*) 0.50 - 1.35 mg/dL    Calcium 95.6  8.4 - 10.5 mg/dL    GFR calc non Af Amer >90  >90 mL/min    GFR calc Af Amer >90  >90 mL/min   PHENYTOIN LEVEL, TOTAL     Status: Normal   Collection Time   03/30/12  8:59 AM      Component Value Range Comment   Phenytoin Lvl 10.8  10.0 - 20.0 ug/mL   PHENYTOIN LEVEL, TOTAL     Status: Abnormal   Collection Time   03/31/12  6:45 AM  Component Value Range Comment   Phenytoin Lvl 22.0 (*) 10.0 - 20.0 ug/mL    Patient Active Problem List  Diagnosis  . Status epilepticus  . Seizure disorder  . Chronic respiratory failure  . Ventilator dependent  . Tracheostomy in place  . Gastrostomy tube dependent  . Static encephalopathy  . Development delay  . Asthma  . Hypothyroidism    Follow Up Issues/Recommendations: 1. Plan to discharge home. Seizure activity resolved, was likely secondary to attempt to wean dilantin. Will continue dilantin 60mg  TID. 2. Will resume home health and home nursing care upon discharge.  3. Aside from seizures, pt is otherwise stable without signs/symptoms of infection.  4. Will follow up with neurology to monitor for appropriate dilantin regimen.    Roderick Pee M 03/31/2012, 3:59 PM

## 2012-03-31 NOTE — Progress Notes (Signed)
CSW was consulted to complete discharge of patient. Pt to transfer to home today via [facility]. Patient's Mother is aware of d/c  CSW signing off as no other CSW needs identified at this time.  Lia Foyer, LCSWA [facility] Clinical Social Worker Contact #: [phone] (weekend)

## 2012-04-01 NOTE — Care Management Note (Signed)
    Page 1 of 1   04/01/2012     11:08:54 AM   CARE MANAGEMENT NOTE 04/01/2012  Patient:  Jeff Barry, Jeff Barry   Account Number:  [id number]  Date Initiated:  03/31/2012  Documentation initiated by:  [facility]  Subjective/Objective Assessment:     Action/Plan:   lives at home with mother, has [facility] for [facility]   Anticipated DC Date:  03/31/2012   Anticipated DC Plan:  HOME W HOME HEALTH SERVICES      DC Planning Services  CM consult      [facility] Choice  HOME HEALTH  Resumption Of Svcs/PTA Provider   Choice offered to / List presented to:  [name]        HH arranged  HH-1 RN      [facility] agency  [facility]   Status of service:  Completed, signed off Medicare Important Message given?   (If response is "NO", the following Medicare IM given date fields will be blank) Date Medicare IM given:   Date Additional Medicare IM given:    Discharge Disposition:  HOME W HOME HEALTH SERVICES  Per UR Regulation:  Reviewed for med. necessity/level of care/duration of stay  If discussed at Long Length of Stay Meetings, dates discussed:    Comments:  03/31/2012 1230 [facility], [phone] for resumption of care. Received call back from [facility] with [facility]. Fax # [phone]. States they will have nurse go out today to follow up. Mother did call her to make her aware he was d/c today. Faxed orders, and H&P to [facility]. Isidoro Donning RN CCM Case Mgmt phone [phone]

## 2012-10-20 ENCOUNTER — Emergency Department (HOSPITAL_COMMUNITY): Payer: Medicaid Other

## 2012-10-20 ENCOUNTER — Encounter (HOSPITAL_COMMUNITY): Payer: Self-pay | Admitting: Cardiology

## 2012-10-20 ENCOUNTER — Inpatient Hospital Stay (HOSPITAL_COMMUNITY)
Admission: EM | Admit: 2012-10-20 | Discharge: 2012-10-23 | DRG: 208 | Disposition: A | Discharge status: Home Health | Payer: Medicaid Other | Attending: Pediatrics | Admitting: Pediatrics

## 2012-10-20 DIAGNOSIS — D72819 Decreased white blood cell count, unspecified: Secondary | ICD-10-CM | POA: Diagnosis present

## 2012-10-20 DIAGNOSIS — J9620 Acute and chronic respiratory failure, unspecified whether with hypoxia or hypercapnia: Secondary | ICD-10-CM | POA: Diagnosis present

## 2012-10-20 DIAGNOSIS — G9349 Other encephalopathy: Secondary | ICD-10-CM | POA: Diagnosis present

## 2012-10-20 DIAGNOSIS — G40909 Epilepsy, unspecified, not intractable, without status epilepticus: Secondary | ICD-10-CM | POA: Diagnosis present

## 2012-10-20 DIAGNOSIS — R6250 Unspecified lack of expected normal physiological development in childhood: Secondary | ICD-10-CM | POA: Diagnosis present

## 2012-10-20 DIAGNOSIS — G40901 Epilepsy, unspecified, not intractable, with status epilepticus: Secondary | ICD-10-CM

## 2012-10-20 DIAGNOSIS — D696 Thrombocytopenia, unspecified: Secondary | ICD-10-CM | POA: Diagnosis present

## 2012-10-20 DIAGNOSIS — E039 Hypothyroidism, unspecified: Secondary | ICD-10-CM | POA: Diagnosis present

## 2012-10-20 DIAGNOSIS — J0410 Acute tracheitis without obstruction: Principal | ICD-10-CM | POA: Diagnosis present

## 2012-10-20 DIAGNOSIS — Z888 Allergy status to other drugs, medicaments and biological substances status: Secondary | ICD-10-CM

## 2012-10-20 DIAGNOSIS — Z79899 Other long term (current) drug therapy: Secondary | ICD-10-CM

## 2012-10-20 DIAGNOSIS — Z931 Gastrostomy status: Secondary | ICD-10-CM

## 2012-10-20 DIAGNOSIS — Z9911 Dependence on respirator [ventilator] status: Secondary | ICD-10-CM

## 2012-10-20 DIAGNOSIS — J45909 Unspecified asthma, uncomplicated: Secondary | ICD-10-CM | POA: Diagnosis present

## 2012-10-20 DIAGNOSIS — Z930 Tracheostomy status: Secondary | ICD-10-CM

## 2012-10-20 DIAGNOSIS — Z7401 Bed confinement status: Secondary | ICD-10-CM

## 2012-10-20 DIAGNOSIS — J189 Pneumonia, unspecified organism: Secondary | ICD-10-CM

## 2012-10-20 LAB — COMPREHENSIVE METABOLIC PANEL
AST: 34 U/L (ref 0–37)
Albumin: 3.4 g/dL — ABNORMAL LOW (ref 3.5–5.2)
Calcium: 9.2 mg/dL (ref 8.4–10.5)
Creatinine, Ser: 0.22 mg/dL — ABNORMAL LOW (ref 0.50–1.35)
GFR calc non Af Amer: >90 mL/min (ref >90)
Total Protein: 8.4 g/dL — ABNORMAL HIGH (ref 6.0–8.3)

## 2012-10-20 LAB — POCT I-STAT 3, ART BLOOD GAS (G3+)
Acid-Base Excess: 4 mmol/L — ABNORMAL HIGH (ref 0.0–2.0)
Bicarbonate: 32.3 mEq/L — ABNORMAL HIGH (ref 20.0–24.0)
O2 Saturation: 94 %
Patient temperature: 98.1
TCO2: 34 mmol/L (ref 0–100)
pCO2 arterial: 64.2 mmHg (ref 35.0–45.0)
pH, Arterial: 7.308 — ABNORMAL LOW (ref 7.350–7.450)
pO2, Arterial: 77 mmHg — ABNORMAL LOW (ref 80.0–100.0)

## 2012-10-20 LAB — CBC WITH DIFFERENTIAL/PLATELET
Basophils Absolute: 0 K/uL (ref 0.0–0.1)
Basophils Relative: 1 % (ref 0–1)
Eosinophils Absolute: 0.1 10*3/uL (ref 0.0–0.7)
Eosinophils Relative: 3 % (ref 0–5)
HCT: 40.7 % (ref 39.0–52.0)
Hemoglobin: 13.7 g/dL (ref 13.0–17.0)
Lymphocytes Relative: 24 % (ref 12–46)
Lymphs Abs: 0.7 10*3/uL (ref 0.7–4.0)
MCH: 26.8 pg (ref 26.0–34.0)
MCHC: 33.7 g/dL (ref 30.0–36.0)
MCV: 79.6 fL (ref 78.0–100.0)
Monocytes Absolute: 0.4 10*3/uL (ref 0.1–1.0)
Monocytes Relative: 12 % (ref 3–12)
Neutro Abs: 1.8 K/uL (ref 1.7–7.7)
Neutrophils Relative %: 61 % (ref 43–77)
Platelets: 146 K/uL — ABNORMAL LOW (ref 150–400)
RBC: 5.11 MIL/uL (ref 4.22–5.81)
RDW: 14.3 % (ref 11.5–15.5)
WBC: 3 K/uL — ABNORMAL LOW (ref 4.0–10.5)

## 2012-10-20 LAB — COMPREHENSIVE METABOLIC PANEL WITH GFR
ALT: 28 U/L (ref 0–53)
Alkaline Phosphatase: 211 U/L — ABNORMAL HIGH (ref 39–117)
BUN: 8 mg/dL (ref 6–23)
CO2: 27 meq/L (ref 19–32)
Chloride: 98 meq/L (ref 96–112)
GFR calc Af Amer: >90 mL/min (ref >90)
Glucose, Bld: 129 mg/dL — ABNORMAL HIGH (ref 70–99)
Potassium: 4.9 meq/L (ref 3.5–5.1)
Sodium: 133 meq/L — ABNORMAL LOW (ref 135–145)
Total Bilirubin: 0.2 mg/dL — ABNORMAL LOW (ref 0.3–1.2)

## 2012-10-20 LAB — PHENYTOIN LEVEL, TOTAL: Phenytoin Lvl: 18.8 ug/mL (ref 10.0–20.0)

## 2012-10-20 LAB — URINALYSIS, ROUTINE W REFLEX MICROSCOPIC
Bilirubin Urine: NEGATIVE
Glucose, UA: NEGATIVE mg/dL
Hgb urine dipstick: NEGATIVE
Ketones, ur: NEGATIVE mg/dL
Leukocytes, UA: NEGATIVE
Nitrite: NEGATIVE
Protein, ur: NEGATIVE mg/dL
Specific Gravity, Urine: 1.014 (ref 1.005–1.030)
Urobilinogen, UA: 1 mg/dL (ref 0.0–1.0)
pH: 7 (ref 5.0–8.0)

## 2012-10-20 LAB — CG4 I-STAT (LACTIC ACID): Lactic Acid, Venous: 1.54 mmol/L (ref 0.5–2.2)

## 2012-10-20 MED ORDER — LEVOFLOXACIN IN D5W 750 MG/150ML IV SOLN
750.0000 mg | INTRAVENOUS | Status: DC
  Administered 2012-10-20: 750 mg via INTRAVENOUS
  Filled 2012-10-20: qty 150

## 2012-10-20 MED ORDER — JEVITY 1.2 CAL PO LIQD
150.0000 mL | Freq: Every day | ORAL | Status: DC
  Administered 2012-10-20: 22:00:00
  Administered 2012-10-21 – 2012-10-23 (×12): 150 mL
  Filled 2012-10-20 (×19): qty 237

## 2012-10-20 MED ORDER — TOPIRAMATE 100 MG PO TABS
100.0000 mg | ORAL_TABLET | Freq: Two times a day (BID) | ORAL | Status: DC
  Filled 2012-10-20 (×2): qty 1

## 2012-10-20 MED ORDER — FLUTICASONE PROPIONATE HFA 220 MCG/ACT IN AERO
3.0000 | INHALATION_SPRAY | Freq: Two times a day (BID) | RESPIRATORY_TRACT | Status: DC
  Administered 2012-10-21 – 2012-10-23 (×5): 3 via RESPIRATORY_TRACT
  Filled 2012-10-20: qty 12

## 2012-10-20 MED ORDER — ARTIFICIAL TEAR OINTMENT OP OINT
1.0000 "application " | TOPICAL_OINTMENT | Freq: Three times a day (TID) | OPHTHALMIC | Status: DC
  Administered 2012-10-20 – 2012-10-23 (×8): 1 via OPHTHALMIC
  Filled 2012-10-20: qty 3.5

## 2012-10-20 MED ORDER — POLYETHYLENE GLYCOL 3350 17 G PO PACK: 17.0000 g | PACK | Freq: Every day | ORAL | Status: DC

## 2012-10-20 MED ORDER — PHENYTOIN 125 MG/5ML PO SUSP
60.0000 mg | ORAL | Status: DC
  Administered 2012-10-21 – 2012-10-23 (×5): 60 mg
  Filled 2012-10-20 (×8): qty 4

## 2012-10-20 MED ORDER — CLONAZEPAM 0.1 MG/ML ORAL SUSPENSION
0.5000 mg | Freq: Every day | ORAL | Status: DC
  Filled 2012-10-20: qty 5

## 2012-10-20 MED ORDER — ALBUTEROL SULFATE HFA 108 (90 BASE) MCG/ACT IN AERS: 2.0000 | INHALATION_SPRAY | RESPIRATORY_TRACT | Status: DC | PRN

## 2012-10-20 MED ORDER — MUPIROCIN 2 % EX OINT: TOPICAL_OINTMENT | Freq: Two times a day (BID) | CUTANEOUS | Status: DC

## 2012-10-20 MED ORDER — POLYETHYLENE GLYCOL 3350 17 G PO PACK
17.0000 g | PACK | Freq: Every day | ORAL | Status: DC
  Administered 2012-10-21 – 2012-10-22 (×2): 17 g
  Filled 2012-10-20 (×5): qty 1

## 2012-10-20 MED ORDER — CETIRIZINE HCL 5 MG/5ML PO SYRP
10.0000 mg | ORAL_SOLUTION | Freq: Every day | ORAL | Status: DC
  Administered 2012-10-21 – 2012-10-22 (×2): 10 mg
  Filled 2012-10-20 (×4): qty 10

## 2012-10-20 MED ORDER — DIAZEPAM 10 MG RE GEL: 10.0000 mg | RECTAL | Status: DC | PRN

## 2012-10-20 MED ORDER — PHENYTOIN 125 MG/5ML PO SUSP
90.0000 mg | Freq: Every day | ORAL | Status: DC
  Filled 2012-10-20: qty 4

## 2012-10-20 MED ORDER — DEXTROSE 5 % IV SOLN
1.0000 g | Freq: Three times a day (TID) | INTRAVENOUS | Status: DC
  Administered 2012-10-20 – 2012-10-23 (×8): 1 g via INTRAVENOUS
  Filled 2012-10-20 (×10): qty 1

## 2012-10-20 MED ORDER — FREE WATER
200.0000 mL | Freq: Every day | Status: DC
  Administered 2012-10-20 – 2012-10-23 (×12): 200 mL

## 2012-10-20 MED ORDER — SODIUM CHLORIDE 0.9 % IV BOLUS (SEPSIS)
500.0000 mL | Freq: Once | INTRAVENOUS | Status: AC
  Administered 2012-10-20: 16:00:00 via INTRAVENOUS

## 2012-10-20 MED ORDER — DEXTROSE 5 % IV SOLN
1.0000 g | Freq: Two times a day (BID) | INTRAVENOUS | Status: DC
  Administered 2012-10-20: 1 g via INTRAVENOUS
  Filled 2012-10-20 (×2): qty 1

## 2012-10-20 MED ORDER — GLYCOPYRROLATE 1 MG PO TABS
2.0000 mg | ORAL_TABLET | Freq: Three times a day (TID) | ORAL | Status: DC | PRN
  Administered 2012-10-21 – 2012-10-22 (×5): 2 mg
  Filled 2012-10-20 (×6): qty 2

## 2012-10-20 MED ORDER — FLUTICASONE PROPIONATE HFA 220 MCG/ACT IN AERO
3.0000 | INHALATION_SPRAY | Freq: Two times a day (BID) | RESPIRATORY_TRACT | Status: DC
  Filled 2012-10-20: qty 12

## 2012-10-20 MED ORDER — CLONAZEPAM 0.1 MG/ML ORAL SUSPENSION: 0.2500 mg | Freq: Every day | ORAL | Status: DC

## 2012-10-20 MED ORDER — TAB-A-VITE/IRON PO TABS
1.0000 | ORAL_TABLET | Freq: Every day | ORAL | Status: DC
  Administered 2012-10-21 – 2012-10-23 (×3): 1 via ORAL
  Filled 2012-10-20 (×4): qty 1

## 2012-10-20 MED ORDER — LEVOTHYROXINE SODIUM 75 MCG PO TABS
75.0000 ug | ORAL_TABLET | Freq: Every day | ORAL | Status: DC
  Administered 2012-10-21 – 2012-10-23 (×3): 75 ug
  Filled 2012-10-20 (×4): qty 1

## 2012-10-20 MED ORDER — SODIUM CHLORIDE 0.9 % IV SOLN
INTRAVENOUS | Status: DC
  Administered 2012-10-21 – 2012-10-22 (×2): via INTRAVENOUS

## 2012-10-20 MED ORDER — LEVETIRACETAM 100 MG/ML PO SOLN
300.0000 mg | Freq: Two times a day (BID) | ORAL | Status: DC
  Administered 2012-10-21 (×2): 300 mg
  Filled 2012-10-20 (×3): qty 5

## 2012-10-20 MED ORDER — SODIUM CHLORIDE 0.9 % IV SOLN: INTRAVENOUS | Status: DC

## 2012-10-20 MED ORDER — VANCOMYCIN HCL 1000 MG IV SOLR
750.0000 mg | Freq: Three times a day (TID) | INTRAVENOUS | Status: DC
  Administered 2012-10-20 – 2012-10-22 (×5): 750 mg via INTRAVENOUS
  Filled 2012-10-20 (×8): qty 750

## 2012-10-20 MED ORDER — CITRIC ACID-SODIUM CITRATE 334-500 MG/5ML PO SOLN
30.0000 mL | Freq: Once | ORAL | Status: AC
  Administered 2012-10-21: 30 mL via ORAL
  Filled 2012-10-20: qty 30

## 2012-10-20 MED ORDER — TOPIRAMATE 100 MG PO TABS
100.0000 mg | ORAL_TABLET | Freq: Two times a day (BID) | ORAL | Status: DC
  Administered 2012-10-21 – 2012-10-23 (×5): 100 mg
  Filled 2012-10-20 (×7): qty 1

## 2012-10-20 MED ORDER — PHENYTOIN 125 MG/5ML PO SUSP
90.0000 mg | Freq: Every day | ORAL | Status: DC
  Administered 2012-10-21 – 2012-10-22 (×3): 90 mg
  Filled 2012-10-20 (×3): qty 4

## 2012-10-20 NOTE — Progress Notes (Signed)
ANTIBIOTIC CONSULT NOTE - INITIAL  Pharmacy Consult for cefepime, vancomycin, levofloxacin Indication:r/o hospital acquired pneumonia  Allergies  Allergen Reactions  . Reglan (Metoclopramide Hcl)     Patient Measurements:   Wt: 59 kg  Vital Signs: BP: 103/59 mmHg (03/09 1426) Pulse Rate: 67 (03/09 1426) Intake/Output from previous day:   Intake/Output from this shift:    Labs: No results found for this basename: WBC, HGB, PLT, LABCREA, CREATININE,  in the last 72 hours The CrCl is unknown because both a height and weight (above a minimum accepted value) are required for this calculation. No results found for this basename: VANCOTROUGH, VANCOPEAK, VANCORANDOM, GENTTROUGH, GENTPEAK, GENTRANDOM, TOBRATROUGH, TOBRAPEAK, TOBRARND, AMIKACINPEAK, AMIKACINTROU, AMIKACIN,  in the last 72 hours   Microbiology: No results found for this or any previous visit (from the past 720 hour(s)).  Medical History: Past Medical History  Diagnosis Date  . Seizures   . Encephalopathy   . Ventilator dependent     Medications:  Scheduled:   Infusions:  . sodium chloride     Assessment: 22 yo M vent dependent - usually only at night but now constant.  Has been on 4100 for the last 3 days due to lack of power (not admitted, though).   Now with suspected aspiration pna - to be treated as HAP since patient has been housed in the facility x 2.5 days.  No h/o renal insuffiency noted.  Previous SCr have been low, noted that patient is bed-bound.  Will assume CrCl ~100 ml/min and monitor SCr, UOP when available.  No antibiotics given yet.  Goal of Therapy:  Vancomycin trough level 15-20 mcg/ml  Plan:  - Vancomycin 750 mg IV q8h - Levofloxacin 750 mg IV q24h - Cefepime 1g IV q12h - Follow up SCr, UOP, cultures, clinical course and adjust as clinically indicated. - Suggest vanc trough at Css with unclear renal funtion  Alison L. Illene Bolus, PharmD, BCPS Clinical Pharmacist Pager:  [phone] Pharmacy: [phone] 10/20/2012 2:45 PM

## 2012-10-20 NOTE — ED Notes (Signed)
In & Out Cath done by [facility]. Sterile technique used.

## 2012-10-20 NOTE — Progress Notes (Signed)
Pt in ER on home vent. Home health nurse at bedside. No respiratory distress noted at this time.  Pt on 1L o2 bleed in to vent.  Pt suctioned by home health nurse,  RT will continue to monitor.

## 2012-10-20 NOTE — ED Notes (Signed)
IV at the bedside, states that port with not pull back on and give any blood at this time. Site not flushing easily at this time, states they are concerned that the catheter may be displaced. EDP informed. Iv team states he may need a dye study to determine placement. Unable to start fluids or antibiotics at this time.

## 2012-10-20 NOTE — ED Notes (Signed)
Waiting for admit orders to be placed. Once orders in pt to be transported upstairs. Reports called.

## 2012-10-20 NOTE — ED Notes (Addendum)
IV team paged for IV access for port-a-cath.

## 2012-10-20 NOTE — ED Notes (Signed)
Talked with Dr. Oletta Lamas, informed that he spoke with radiologist and informed that port-a-cath was ok for use at this time. No swelling noted to site.

## 2012-10-20 NOTE — ED Notes (Signed)
Pt to department from 4100. Pt was brought here Friday because of lack of power. Pt has had a low grade fever. Pt is vent dependent and now requires constant ventilation when it's normally only at night. Pt also is breathing over the vent which is not normal for him. Pt is bed bound and total care assistance.

## 2012-10-20 NOTE — ED Notes (Signed)
RT called to assess pt status.

## 2012-10-20 NOTE — Progress Notes (Signed)
23 year old M with a complex medical history including encephalopathy, seizure disorder, trach and vent dependent, G-tube dependent   Last 24 hrs with increased vent requirement, oxygen requirement, WOB, sz activity, lethargy, secretion production.  Has had LGF. Increase in coughing and congested respirations ________________________________________________________________________  Plan 1). Continue home meds and feeding regimen 2). Continue home vent settings and oxygen support 3). Pending cultures will emperically cover with vanco and ceftazidime  Signed I have performed the critical and key portions of the service and I was directly involved in the management and treatment plan of the patient. I have personally seen and examined the patient and have discussed with housestaff, nursing, pharmacy.  I have reviewed the chart and vitals. I have read the trainees note above and agree  I spent 1 hour in the care of this patient.  The caregivers were updated regarding the patients status and treatment plan at the bedside.   Criselda Peaches, MD  . 6:23 PM ________________________________________________________________________

## 2012-10-20 NOTE — ED Notes (Signed)
PCCM at bedside. °

## 2012-10-20 NOTE — Progress Notes (Signed)
Patient transported from emergency department to PICU room 6154. Patient remains on home vent with oxygen set at 1lpm. Sp02=97% at this time. Will continue to monitor respiratory status and will placed on [place] ventilator if patient starts to deteriorate.

## 2012-10-20 NOTE — ED Notes (Signed)
Attempted IV access without success.
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
## 2012-10-21 DIAGNOSIS — J9620 Acute and chronic respiratory failure, unspecified whether with hypoxia or hypercapnia: Secondary | ICD-10-CM | POA: Diagnosis present

## 2012-10-21 DIAGNOSIS — G40909 Epilepsy, unspecified, not intractable, without status epilepticus: Secondary | ICD-10-CM

## 2012-10-21 DIAGNOSIS — Z9911 Dependence on respirator [ventilator] status: Secondary | ICD-10-CM

## 2012-10-21 MED ORDER — BIOTENE DRY MOUTH MT LIQD
15.0000 mL | Freq: Four times a day (QID) | OROMUCOSAL | Status: DC
  Administered 2012-10-21 – 2012-10-23 (×10): 15 mL via OROMUCOSAL

## 2012-10-21 MED ORDER — MUPIROCIN 2 % EX OINT
TOPICAL_OINTMENT | Freq: Two times a day (BID) | CUTANEOUS | Status: DC
  Administered 2012-10-21 – 2012-10-22 (×2): 1 via TOPICAL
  Administered 2012-10-22: 13:00:00 via TOPICAL
  Administered 2012-10-22 – 2012-10-23 (×2): 1 via TOPICAL
  Filled 2012-10-21: qty 22

## 2012-10-21 MED ORDER — CLONAZEPAM 0.5 MG PO TABS
0.5000 mg | ORAL_TABLET | Freq: Every day | ORAL | Status: DC
  Administered 2012-10-21 – 2012-10-22 (×2): 0.5 mg
  Filled 2012-10-21 (×2): qty 1

## 2012-10-21 MED ORDER — CHLORHEXIDINE GLUCONATE 0.12 % MT SOLN
15.0000 mL | Freq: Two times a day (BID) | OROMUCOSAL | Status: DC
  Administered 2012-10-21 – 2012-10-23 (×4): 15 mL via OROMUCOSAL
  Filled 2012-10-21 (×6): qty 15

## 2012-10-21 MED ORDER — ALBUTEROL SULFATE HFA 108 (90 BASE) MCG/ACT IN AERS: 4.0000 | INHALATION_SPRAY | RESPIRATORY_TRACT | Status: DC | PRN

## 2012-10-21 MED ORDER — CLONAZEPAM 0.5 MG PO TABS
0.2500 mg | ORAL_TABLET | Freq: Every day | ORAL | Status: DC
  Administered 2012-10-21 – 2012-10-23 (×3): 0.25 mg
  Filled 2012-10-21 (×3): qty 1

## 2012-10-21 MED ORDER — WHITE PETROLATUM GEL
Status: AC
  Administered 2012-10-21: 0.2 via TOPICAL
  Filled 2012-10-21: qty 5

## 2012-10-21 MED ORDER — LEVETIRACETAM 100 MG/ML PO SOLN
400.0000 mg | Freq: Two times a day (BID) | ORAL | Status: DC
  Administered 2012-10-22 – 2012-10-23 (×3): 400 mg
  Filled 2012-10-21 (×5): qty 5

## 2012-10-21 MED FILL — White Petrolatum-Mineral Oil Ophth Ointment: OPHTHALMIC | Qty: 3.5 | Status: AC

## 2012-10-21 NOTE — Progress Notes (Signed)
Patient desat to 88%, while being provided routine care.  Patient sounds like he has secretions in his trach.  Trach suctioned with in-line suction and obtained a moderate amount of white secretions.  Patient was able to cough up some secretion during this time as well.  Patient's O2 sats quickly improved to the high 90's.  Remains on RA.

## 2012-10-21 NOTE — Progress Notes (Signed)
Pediatric Teaching Service Hospital Progress Note  Patient name: Jeff Barry Medical record number: [id number] Date of birth: 1990/10/09 Age: 22 y.o. Gender: male    LOS: 1 day   Primary Care Marla Pouliot: Rosi Secrist, MD  Subjective: Overnight, continued on home vent settings SIMV-VG, PEEP 5, rate 12, TV 400 ml (~6 ml/kg). Continued to have increased oxygen requirement to 2L, although did not have any desaturations. As per nursing, did have two brief seizures overnight.   Objective: Vital signs in last 24 hours: Temp:  [97.1 F (36.2 C)-98.1 F (36.7 C)] 98.1 F (36.7 C) (03/10 0800) Pulse Rate:  [59-92] 79 (03/10 0800) Resp:  [12-30] 17 (03/10 0800) BP: (92-115)/(44-73) 97/49 mmHg (03/10 0800) SpO2:  [91 %-100 %] 100 % (03/10 0800) FiO2 (%):  [24 %] 24 % (03/10 0736) Weight:  [63.549 kg (140 lb 1.6 oz)] 63.549 kg (140 lb 1.6 oz) (03/09 2355)  Wt Readings from Last 3 Encounters:  10/20/12 63.549 kg (140 lb 1.6 oz)  03/30/12 58.968 kg (130 lb) (11%*, Z = -1.21)   * Growth percentiles are based on CDC 2-20 Years data.      Intake/Output Summary (Last 24 hours) at 10/21/12 0956 Last data filed at 10/21/12 7829  Gross per 24 hour  Intake   1040 ml  Output    890 ml  Net    150 ml   UOP: 1.4 ml/kg/hr   Physical exam: BP 97/49  Pulse 79  Temp(Src) 98.1 F (36.7 C) (Axillary)  Resp 17  Wt 63.549 kg (140 lb 1.6 oz)  BMI 23.31 kg/m2  SpO2 100% GEN: Delayed, non-interactive and edematous-appearing male in NAD HEENT: Macrocephalic. Eyes open with occulube. Conjunctival injection present bilaterally. Nares patent without flaring. Moist mucous membranes, tongue protruding. NECK: Tracheostomy in place. CV: RRR, S1 and S2 equal intensity. No murmurs/rubs/gallops. RESP: Comfortable WOB. Equal and course breath sounds bilaterally with some end-expiratory wheezes bilaterally. No focal crackles. ABD: Non-distended, G-tube in place (clean and dry), full but overall soft  to palpation without masses or organomegaly. EXTR: Atrophic with contractures throughout. Warm and well-perfused. SKIN: Warm and well-perfused without rashes, lesions or breakdown. NEURO: Appears awake but is non-interactive. Does not track with eyes. Hypertonia present throughout. No purposeful movement.    Labs/Studies:  Blood, urine and trach cultures pending.   Assessment/Plan: 22 year old male with history of static encephalopathy, seizure disorder and asthma who is tracheostomy and G-tube dependent here for tachypnea and new oxygen/ventilator requirement. Acute on chronic respiratory failure likely secondary to infectious profess in setting of increased trach secretions and respiratory distress, which may be viral or bacterial in etiology.   RESPIRATORY: Acute on chronic respiratory failure. At baseline, is only vent dependent at night, but has not been able to get off vent during the day with new symptoms. - Continue home vent settings for now. Plan to wean oxygen as tolerated. Goal is to return to baseline of only needing ventilator support during the day, although getting there may be a very slow process (as it has in the past with Bunyan's acute illnesses).  - Wheezy on exam today- will increase albuterol to 4 puffs PRN  INFECTIOUS DISEASE: Symptoms concerning for acute infectious process. Likely has viral or bacterial pneumonia/tracheitis. - Continue empiric treatment with Ceftazidime and Vancomycin pending results of cultures. Plan to complete 48-hour rule-out and narrow coverage as able. - Follow-up results of cultures  CARDIOVASCULAR: Hemodynamically stable. BP's a little low overnight- continue to follow closely today.  NUTRITION/GI:  Tolerating home feeding regimen with bolus G-tube feeds 5x/day. Continue unless respiratory status prohibits.  NEURO: History of static encephalopathy with seizure disorder. Continuing on all home meds. Phenytoin level checked in ED prior to  admission, will touch base with primary neurologist (Dr. Sharene Skeans) today regarding this.   SOCIAL: Currently followed at [facility]. Needs to transition to adult primary care Angy Swearengin. Will ask for assistance from social work with help facilitating this process.   ACCESS: Has port, infusing 20 ml/hr normal saline.  DISPO: Admitted to PICU for treatment of acute on chronic respiratory failure (trach dependent) and treatment of presumed tracheitis/pneumonia.    Please see also attending note(s) for any further details/final plans/additions.    Maricela Bo MD  10/21/2012 9:56 AM

## 2012-10-21 NOTE — Progress Notes (Signed)
INITIAL NUTRITION ASSESSMENT  DOCUMENTATION CODES Per approved criteria  -Not Applicable   INTERVENTION:  Continue home feedings of Jevity 1.2 150 ml 5 times daily.  NUTRITION DIAGNOSIS: None at this time.   Goal: Intake to meet >90% of estimated needs, met.  Monitor:  Vent status, TF tolerance/adequacy, weight trend, labs.  Reason for Assessment: Vent; Home TF  22 y.o. male  Admitting Dx: Tachypnea, new vent/oxygen requirements  ASSESSMENT: Patient's normal routine is to use the ventilator at night.  Came to the hospital on Friday due to power outage at his home preventing use of ventilator.  While in the hospital, he developed "micro-seizures," which included episodes of facial twitching and quick myoclonic jerks. Since mother was unable to get him off the vent Saturday morning due to "micro-seizures" and with increase in usual temperature, he was taken to the ED for official admission to the hospital.  Jeff Barry is receiving feedings as he would at home.    Height: Ht Readings from Last 1 Encounters:  03/30/12 5\' 5"  (1.651 m) (5%*, Z = -1.64)   * Growth percentiles are based on CDC 2-20 Years data.    Weight: Wt Readings from Last 1 Encounters:  10/20/12 140 lb 1.6 oz (63.549 kg)    Ideal Body Weight: 61.8 kg  % Ideal Body Weight: 103%  Wt Readings from Last 10 Encounters:  10/20/12 140 lb 1.6 oz (63.549 kg)  03/30/12 130 lb (58.968 kg) (11%*, Z = -1.21)   * Growth percentiles are based on CDC 2-20 Years data.    Usual Body Weight: 130 lb (7 months ago)  % Usual Body Weight: 108%  Weight is above usual weight likely related to swelling.  BMI:  Body mass index is 23.31 kg/(m^2). WNL  Estimated Nutritional Needs: Kcal: 900 Protein: 40-50 gm Fluid: 1-1.5 L  Skin: no problems noted  Diet Order:  Jevity 1.2 150 ml 5 times daily; each bolus is followed by 200 ml free water.  This provides a total of 900 kcals, 42 gm protein, 1608 ml free water  daily.  EDUCATION NEEDS: -No education needs identified at this time   Intake/Output Summary (Last 24 hours) at 10/21/12 0915 Last data filed at 10/21/12 0454  Gross per 24 hour  Intake   1040 ml  Output    890 ml  Net    150 ml     Labs:   Recent Labs Lab 10/20/12 1522  NA 133*  K 4.9  CL 98  CO2 27  BUN 8  CREATININE 0.22*  CALCIUM 9.2  GLUCOSE 129*    CBG (last 3)  No results found for this basename: GLUCAP,  in the last 72 hours  Scheduled Meds: . Artificial Tear Ointment  1 application Both Eyes TID  . cefTAZidime (FORTAZ)  IV  1 g Intravenous Q8H  . cetirizine HCl  10 mg Per Tube QHS  . clonazePAM  0.25 mg Per Tube Daily  . clonazePAM  0.5 mg Per Tube QHS  . feeding supplement (JEVITY 1.2 CAL)  150 mL Per Tube 5 X Daily  . fluticasone  3 puff Inhalation BID  . free water  200 mL Per Tube 5 X Daily  . levETIRAcetam  300 mg Per Tube BID  . levothyroxine  75 mcg Per Tube Daily  . multivitamins with iron  1 tablet Oral Daily  . mupirocin ointment   Topical BID  . phenytoin  60 mg Per Tube Custom  . phenytoin  90  mg Per Tube QHS  . polyethylene glycol  17 g Per Tube Daily  . topiramate  100 mg Per Tube BID  . vancomycin  750 mg Intravenous Q8H    Continuous Infusions: . sodium chloride 20 mL/hr at 10/21/12 0010    Past Medical History  Diagnosis Date  . Seizures   . Encephalopathy   . Ventilator dependent     Past Surgical History  Procedure Laterality Date  . Tracheostomy    . Gastrostomy      Joaquin Courts, RD, LDN, CNSC Pager# [phone] After Hours Pager# [phone]

## 2012-10-21 NOTE — Progress Notes (Signed)
Patient desat to 82% with routine care.  Patient sounds congested and has secretions in his trach.  Patient's trach suctioned via in-line suction catheter.  Obtained moderate amount of thin secretions.  Patient was able to provide a cough during this time as well.  O2 sats quickly improved to the high 90's range.  Patient remains on RA.

## 2012-10-21 NOTE — Patient Care Conference (Signed)
Multidisciplinary Family Care Conference Present:  Terri Bauert LCSW, Jim Like RN Case Manager, Loyce Dys DieticianLowella Dell Rec. Therapist, Dr. Joretta Bachelor, Candace Kizzie Bane RN, Roma Kayser RN, BSN, [facility] Gershon Crane RN ChaCC  Attending: Andrez Grime Patient RN: Tresa Garter   Plan of Care:  At home was desatting, increased secretions, increased seizures and did not transition from vent at night to routine non-vent in day. On home vent here.

## 2012-10-21 NOTE — H&P (Signed)
________________________________________________________________________  Signed I have performed the critical and key portions of the service and I was directly involved in the management and treatment plan of the patient. I have personally seen and examined the patient and have discussed with housestaff, nursing, pharmacy.  I have reviewed the chart and vitals. I have read the trainees note above and agree  I spent 1 hour in the care of this patient.  The caregivers were updated regarding the patients status and treatment plan at the bedside.   Criselda Peaches, MD  @TODAYDATE @ 6:52 AM ________________________________________________________________________

## 2012-10-21 NOTE — Progress Notes (Signed)
Pt seen and discussed with Drs Lunette Stands, and Stevphen Rochester and RT/RN staff.  Agree with attached note.   Jeff Barry did fairly well overnight on ventilator. Continues with short, twitching episodes predominately of upper ext c/w seizure activity.  Witnessed twice overnight and several times this morning, lasts seconds.  No VS changes noted.  Remains on home vent Vt 400, PEEP 5, rate 12, 2L O2 bleed in.  O2 sats stable mid 90s to 100.  RR 20-30s while awake, rides vent rate while asleep.  Good UOP.  Tolerated GT feeds.  Remains on Vanc/Ceftaz, cultures pending.  Spoke with mother at length about continued/transition of care to Adult MDs.  Discussed seizures and possible increase of Keppra vs Dilantin. Robinal for increased secretions overnight.  PE: VS reviewed GEN: delayed, minimally responsive male in no resp distress on vent HEENT: OP moist, large tongue, spontaneous eye opening, corneal edema baseline, no nasal flaring, no grunting. 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  Initial concern for pneumonia although CXR negative.  Trach aspirate pending.  Will continue Vanc/Ceftaz pending culture results.  Increased seizure activity well tolerated.  Usual Dilantin level mid 20s, current 18 but with low Albumin corrected to 20s.  Consider increasing Dilantin to 90/60/90 or Keppra to 400mg  BID.  Will discuss Keppra increase as preferred change with mother.  Will continue to wean oxygen as tolerated.  If stable off oxygen, will try off vent for a few hours this evening.  Will continue GT feeds.  Will continue to follow.  Time spent 1 hr  Jeff Barry.  Jeff Knife, MD Pediatric Critical Care 10/21/2012,3:44 PM

## 2012-10-21 NOTE — Progress Notes (Signed)
Pt arrived to floor stable on home vent.  Pt to remain on home vent unless status changes.  Pt home vent settings PEEP 5, TV 400, RR 12,  2L/M O2.  Pt's O2 remained in the upper 90's all night.  RR ranged from 12 while sleeping to the 20's while awake.  HR ranged from 62 to the low 90's.  Temperature and BP stable.  Mom left soon after arrival to floor to stay with father on another unit.  Pt to remain on all home meds and feeding schedules.  Pt had BC and UC drawn in the ED yesterday .  Pt had 1 reported seizure at 2110 after arrival to the floor that resolved soon after the nurse entered the room.  Pt's skin is intact and appropriate.  Turning pt q2h.  BBS have ronchi with good air movement and no desats.  Moderate white/yellow secretions obtained by RT via inline suction.  Pt has some mild edema to face and extremities.  Chest xray was negative.  Pt will continue on abx.

## 2012-10-22 LAB — CBC WITH DIFFERENTIAL/PLATELET
Basophils Relative: 0 % (ref 0–1)
Eosinophils Relative: 2 % (ref 0–5)
HCT: 37.3 % — ABNORMAL LOW (ref 39.0–52.0)
Hemoglobin: 12.6 g/dL — ABNORMAL LOW (ref 13.0–17.0)
MCHC: 33.8 g/dL (ref 30.0–36.0)
MCV: 77.4 fL — ABNORMAL LOW (ref 78.0–100.0)
Monocytes Absolute: 0.6 10*3/uL (ref 0.1–1.0)
Monocytes Relative: 23 % — ABNORMAL HIGH (ref 3–12)
Neutro Abs: 1.2 10*3/uL — ABNORMAL LOW (ref 1.7–7.7)
RDW: 13.9 % (ref 11.5–15.5)

## 2012-10-22 LAB — URINE CULTURE
Colony Count: NO GROWTH
Culture: NO GROWTH

## 2012-10-22 MED ORDER — CITRIC ACID-SODIUM CITRATE 334-500 MG/5ML PO SOLN
30.0000 mL | Freq: Every day | ORAL | Status: DC
  Administered 2012-10-22 – 2012-10-23 (×2): 30 mL via ORAL
  Filled 2012-10-22 (×3): qty 30

## 2012-10-22 NOTE — Progress Notes (Signed)
10/22/12, Kathi Der RNC-MNN, BSN, [phone],  CM notified Frances Furbish of possible discharge tomorrow, will send discharge summary at that time.    CSW notified of transportation need at time of d/c via [facility].  Will follow.

## 2012-10-22 NOTE — Progress Notes (Signed)
Patient resting in bed with side rails up x4.  VS stable. Ventilator to trach remains on appropriate settings. Trach suctioned for small amount secretions this shift. Tolerating GT feedings well. Turned and repositioned q 2 hr. No distress noted.

## 2012-10-22 NOTE — Progress Notes (Signed)
CRITICAL VALUE ALERT  Critical value received:  Positive blood culture, gram positive cocci in clusters  Date of notification:  10/22/12  Time of notification: 0118  Critical value read back:yes  Nurse who received alert:  Vincente Liberty, RN  MD notified (1st page):  Dr. Donnamae Jude  Time of first page:  0118  MD notified (2nd page):  Time of second page:  Responding MD:  Dr. Donnamae Jude  Time MD responded:  [phone]

## 2012-10-22 NOTE — Progress Notes (Signed)
[facility] Progress Note  Patient name: Jeff Barry  Medical record number: [id number] Date of birth: 12-Nov-1990  Age: 22 y.o.  Gender: male    LOS: 2 days   Subjective/Overnight Events: Stable on home vent settings SIMV-VG, PEEP 5, rate 12, TV 400 ml (~6 ml/kg), FiO2 21%.  Planned to trial trach collar last evening but fell asleep prior to changes.  No witnessed seizure activity.  Keppra increased to 400mg  BID. Blood culture (3/9) growing GPCs in clusters at 36 hours.   Objective: Vital signs in last 24 hours: Temp:  [97 F (36.1 C)-99.9 F (37.7 C)] 98.3 F (36.8 C) (03/11 0400) Pulse Rate:  [65-84] 65 (03/11 0600) Resp:  [12-20] 12 (03/11 0600) BP: (89-113)/(49-72) 101/72 mmHg (03/11 0600) SpO2:  [82 %-100 %] 97 % (03/11 0600) FiO2 (%):  [21 %-24 %] 21 % (03/11 0600)    Intake/Output Summary (Last 24 hours) at 10/22/12 0647 Last data filed at 10/22/12 0600  Gross per 24 hour  Intake   2740 ml  Output   1584 ml  Net   1156 ml   UOP: 1.0 ml/kg/hr   PE: GEN: delayed male, awake but minimally responsive to exam, NAD HEENT: scleral injection bilaterally, no discharge, MMM, trach collar in place CV: RRR, no murmur appreciated, radial pulses 2+ and equal bilaterally, cap refill < 2 sec distally LUNGS: scattered, transmitted upper airway noises, good air movement, breathing minimally over vent, no wheeze, crackles or retraction ABD: soft, nontender, nondistended, Gtube c/d/i EXT: thin extremities with hypertonicity throughout, WWP SKIN: no rashes or lesions NEURO: awake but not interactive, no purposeful movement   Labs/Studies: Blood culture (3/9) - GPCs in clusters at 36 hours  Assessment/Plan: 22 year old male with static encephalopathy, seizure disorder, asthma and trach/Gtube dependence admitted with increased secretions and increased oxygen requirement concerning for viral vs. bacterial tracheitis.  Now stable on home vent settings on  RA, afebrile and tolerating feeds.   RESPIRATORY: Acute on chronic respiratory failure.  Acute respiratory failure resolving.  Now tolerating vent settings without supplemental O2.  - trial home trach collar (~0.5L O2) this am - continue home vent settings at night - continue albuterol 4 puffs PRN wheezing/retractions  INFECTIOUS DISEASE: Increased trach secretions and oxygen requirement on admission concerning for viral vs. bacterial tracheitis. Has grown Serratia and Moraxella previously.  Pneumonia less likely given clear CXR non-focal physical exam. One of two blood cultures on admission now growing GPCs at 36 hours though suspect contaminant given clinical stability.   - will d/c vanc this am - will continue ceftaz while trach gram stain and culture pending - if WBCs/organisms on gram stain, plan to treat for 7 days for tracheitis; cefixime may be appropriate PO option given good gram negative coverage (though no activity against pseudomonas and may need to add aminoglycoside for adequate serratia coverage); ciprofloxacin may be suitable alternative - if gram stain benign, assume viral tracheitis and discontinue antibiotic coverage  - f/u blood and trach culture results  FEN/GI: Tolerating home feeds. - cont home regimen (bolus Gtube feeds 5x/day)  NEURO: History of static encephalopathy with seizure disorder.  No witnessed seizure activity overnight.  Keppra increased to 400mg  BID after discussions with mom and Dr. Sharene Skeans.  - cont home phenytoin - cont keppra at increased dose (400mg  BID)  SOCIAL: Currently followed at [facility]. Needs to transition to adult primary care Jeff Barry. Will ask for assistance from social work with help facilitating this process.   ACCESS:  Port (infusing 20 ml/hr normal saline)  DISPO: Admitted to PICU for treatment of acute on chronic respiratory failure (trach dependent); discharge pending suitable PO alternative for tracheitis treatment, stable on home  settings for respiratory support  Signed: Donnamae Jude, MD Pediatric Resident PGY-3 10/22/2012 6:47 AM    ADDENDUM  Pt seen and discussed with Dr Drucie Opitz, RN staff, and RT staff.  Agree with above note.    Jeff Barry did well overnight on home vent on RA.  No witnessed seizure activity reported.  Pt remained afebrile.  Repeat CBC this AM with continued low WBC of 2.6 (ANC 1.2) and platelets 128.  Pt has had similar counts in past 5 yrs.  Tried off vent this morning but pt with minimal resp effort and rapid desat to mid 80s.  Pt quickly recovered once placed back on vent at RA.  Pt remains awake at baseline.  Continues with copious thin secretions on Robinal.    PE: VS reviewed GEN: delayed male, in no resp distress, spontaneous eye opening and wandering gaze HEENT: OP moist, large tongue, no nasal flaring Neck: trach in place Chest: good aeration while on vent, poor air movement with shallow resp when on trach collar, coarse BS, no wheeze/crackles/retractions CV: RRR, nl s1/s2, no murmur noted, 2+ pulses Abd: protuberant, soft, NT, ND, + BS, GT intact Ext: WWP, baseline edema  A/P  22 yo male with static encephalopathy, seizure disorder, acute on chronic resp failure requiring vent/trach dependent, GT feed dependent, and possible tracheitis.  Blood culture from 3/9 likely contaminant, culture drawn 2 hrs prior remains NGTD.  F/u culture drawn last night.  Will d/c Vanc as patient does not have evidence of pneumonia or significant bacterial infection.  Will continue Ceftaz while await trach culture and f/u blood cultures.  Continue home vent and trial off as tolerated.   Spoke with mother at length by phone, decided to increase Keppra for seizures and continue Dilantin as previous dose.  Recommend outpatient f/u of white count/platelet in future.  Will contact Case Manager/SW to try and set up possible discharge home tomorrow if stable.  Will continue to follow.  Time spent 1 hr  Elmon Else.  Mayford Knife, MD Pediatric Critical Care 10/22/2012,2:46 PM

## 2012-10-22 NOTE — Care Management Note (Unsigned)
    Page 1 of 1   10/22/2012     11:51:02 AM   CARE MANAGEMENT NOTE 10/22/2012  Patient:  Jeff Barry, Jeff Barry   Account Number:  [id number]  Date Initiated:  10/22/2012  Documentation initiated by:  CRAFT,TERRI  Subjective/Objective Assessment:   22 year old male admitted 10/20/12 with tachypnea     Action/Plan:   D/C when medically stable   Anticipated DC Date:  10/25/2012   Anticipated DC Plan:  HOME W HOME HEALTH SERVICES  In-house referral  Nutrition      DC Planning Services  CM consult  PCP issues      [facility] Choice  Resumption Of Svcs/PTA Provider   Choice offered to / List presented to:             [facility] agency  [facility]   Status of service:  In process, will continue to follow  Per UR Regulation:  Reviewed for med. necessity/level of care/duration of stay  Comments:  10/22/12, Kathi Der RNC-MNN, BSN, [phone].  CM received referral.  Pt will be followed by Family Medicine at [facility].  Pt is currently active with [facility]. Will continue to follow.

## 2012-10-23 LAB — CULTURE, BLOOD (ROUTINE X 2)

## 2012-10-23 MED ORDER — LEVETIRACETAM 100 MG/ML PO SOLN: 400.0000 mg | Freq: Two times a day (BID) | ORAL | Status: DC

## 2012-10-23 MED ORDER — CIPROFLOXACIN HCL 750 MG PO TABS: 750.0000 mg | ORAL_TABLET | Freq: Once | ORAL | Status: DC

## 2012-10-23 MED ORDER — HEPARIN SOD (PORK) LOCK FLUSH 100 UNIT/ML IV SOLN
500.0000 [IU] | INTRAVENOUS | Status: AC | PRN
  Administered 2012-10-23: 500 [IU]

## 2012-10-23 MED ORDER — CIPROFLOXACIN HCL 500 MG PO TABS
500.0000 mg | ORAL_TABLET | Freq: Once | ORAL | Status: DC
  Filled 2012-10-23: qty 1

## 2012-10-23 MED ORDER — CIPROFLOXACIN HCL 500 MG PO TABS: 500.0000 mg | ORAL_TABLET | Freq: Once | ORAL | Status: DC

## 2012-10-23 MED ORDER — CIPROFLOXACIN HCL 500 MG PO TABS: 500.0000 mg | ORAL_TABLET | Freq: Two times a day (BID) | ORAL | Status: AC

## 2012-10-23 NOTE — Progress Notes (Signed)
Pt seen and discussed with Dr Okey Dupre, RN and RT staff.  Agree with attached note.   Jeff Barry did well overnight from a respiratory and seizure standpoint.  Remained on home vent on RA without desats.  No reported seizure activity.  He did have several recorded low BPs, but after recheck BPs were within normal range.  Also had some low HRs in the 40s this morning which he has had multiple times in the past while asleep and turned to one side.  Perfusion unchanged.  Spoke with mother at length about ventilator needs and antibiotic coverage for presumed tracheitis.  Pt unable to come off Vent again this morning as he did not show much resp effort.  He was asleep during this trial however.  Continues with good UOP and large, soft stools.  Miralax held this morning. Secretions minimal, thin and white.  PE: VS reviewed GEN: delayed, trached, sleepy but min arousable (baseline) male, on vent in NAD HEENT: OP moist, large protruding tongue, no nasal flaring Neck: supple, trach intact Chest: B good aeration on vent, slight coarse BS, no wheeze, no crackles,  CV: sinus brady, RR, nl s1/s2, no murmur noted, 2+ pulses Abd: soft, protuberant, NT, + BS, GT intact Ext: WWP, baseline edema Neuro: baseline interaction, increased tone,  A/P  22 yo with static encephalopathy, acute on chronic respiratory failure requiring trach/vent, GT feed dependent, and seizure disorder.  Improved presumed tracheitis, gram stain with rare GP cocci and rods and abundant WBCs.  Will treat as outpatient with Cipro to complete course of Abx.  Recommend continuing baseline Vent support as patient has good chest rise and stable O2 sats on these settings.  Mother will attempt to wean off vent during awake hours, but pt may be progressing to require full time vent support.  A single blood culture from admission was positive for coag neg staph, presumed contaminant.  Earlier and subsequent BCx remain NGTD.  Mother to followup with Adult/FP  medicine to transition from Pediatric to Adult care.    Pt discharged home this afternoon.  [facility] transported pt to home.    Time spent: 2 hr  Elmon Else. Mayford Knife, MD Pediatric Critical Care 10/23/2012,1:43 PM

## 2012-10-23 NOTE — Discharge Summary (Signed)
Pediatric Teaching Program  [address]  [place], [place] [address] Phone: [phone] Fax: [phone]  Patient Details  Name: Jeff Barry MRN: [id number] DOB: April 13, 1991  DISCHARGE SUMMARY    Dates of Hospitalization: 10/20/2012 to 10/23/2012  Reason for Hospitalization: Tachypnea, increased O2 requirement and ventilator requirement  Problem List: Active Problems:   Acute-on-chronic respiratory failure  Final Diagnoses: Tracheitis  Brief Hospital Course (including significant findings and pertinent laboratory data):  Jeff Barry was admitted for tachypnea, increased FiO2 requirement and inability to tolerate time off vent with trach collar in the daytime as per his home regimen. Jeff Barry's acute on chronic respiratory failure was concerning for pneumonia or tracheitis, but a chest x-ray was normal without evidence of a focal consolidation. A lower respiratory tract sample was obtained and sent for culture. Blood and urine cultures were also sent. In the ED, he was initially started on empiric antibiotic coverage with Cefepime, Vancomycin and Levofloxacin. Upon admission, coverage was changed to Ceftazidime (to provide Pseudomonas coverage) and Vancomycin (MRSA coverage). After admission, he continued on his home ventilator settings all day and night. He initially required 2 liters of supplemental oxygen due to desaturations noted in the ED.   During his hospital stay, he was started on Albuterol PRN for increased WOB and some wheezing. He continued on Vancomycin and Ceftazidime for empiric treatment of his pneumonia/tracheitis, but Vancomycin was discontinued at 48 hours because cultures were not concerning and he was improving clinically. One of two initial blood cultures did grow GPCs in clusters, but this was thought to be most likely due to a contaminant and a repeat blood culture was negative at the time of discharge. [name] was eventually able to be weaned back to an FiO2 of 21%. An  attempt was made to transition him back to trach collar during the day (as per his home regimen), but when placed on a trial of trach collar he made little respiratory effort and quickly desatted to 80% before needing to be placed back on his home vent. He was stable from a respiratory standpoint at the time of discharge. Plans were made for the family, with the assistance of home health nursing, to attempt to wean Jeff Barry slowly back to his usual home regimen of trach collar during the day with ventilatory support at night. Trach cultures were still pending at the time of discharge, but due to concern for bacterial tracheitis, he was discharged home on an 8-day course of Ciprofoxacin. Gram stain of his tracheostomy sample showed rare GPR's and GPC's in pairs and gram stain of blood culture showed GPC's in clusters- speciation will be followed after discharge home.  [name] also had increased frequency of seizures noted during this hospitalization. His primary neurologist, Dr. Sharene Skeans, was consulted and recommended increasing his Keppra dose to 400 mg BID. This change resulted in resolution of his seizures and he had no noted seizure activity for >24h prior to discharge. He was discharged home on this new Keppra dose.  The remainder of Jeff Barry's hospitalization was uneventful.  He was continued on his home tube feeding regimen, which he tolerated without complications. He received all of his home medications without difficulty. He will resume all home health orders after discharge home. He was transported home by EMS because of his ventilator dependence.  Discussions were held with family regarding the need to transition Jeff Barry to adult primary care providers. Prior to discharge, a hospital follow-up and establish care appointment was made with [facility] at [facility] on 10/31/12  at 10:15am.   Focused Discharge Exam (done by Dr. Lonia Chimera on 10/23/12): BP 101/70  Pulse 51  Temp(Src) 97.7 F (36.5 C)  (Axillary)  Resp 12  Ht 5' (1.524 m)  Wt 63.549 kg (140 lb 1.6 oz)  BMI 27.36 kg/m2  SpO2 96% Gen: Lying quietly in bed, awake but not interactive, in NAD.  HEENT: Sclerae injected without drainage, MMM.  CV: RRR, no murmur, brisk cap refill, 2+ distal pulses.  Resp: Coarse transmitted upper airway noises, good air entry, no wheezes or crackles, no retractions.  Abd: GT site c/d/i, soft, NT, ND.  Ext: WWP, multiple contractures.  Discharge Weight: 63.549 kg (140 lb 1.6 oz) (per bedscale)   Discharge Condition: Improved  Discharge Diet: Resume diet  Discharge Activity: Ad lib (bed-bound)   Procedures/Operations: none Consultants: none  Discharge Medication List    Medication List    ASK your doctor about these medications       acetaminophen 160 MG/5ML solution  Commonly known as:  TYLENOL  Place 480 mg into feeding tube every 4 (four) hours as needed. For pain.     albuterol 108 (90 BASE) MCG/ACT inhaler  Commonly known as:  PROVENTIL HFA;VENTOLIN HFA  Inhale 2 puffs into the lungs every 4 (four) hours as needed. For wheezing/congestion.     alum & mag hydroxide-simeth 200-200-20 MG/5ML suspension  Commonly known as:  MAALOX/MYLANTA  Place 30 mLs into feeding tube every 4 (four) hours as needed. For upset stomach     Artificial Tear Ointment Oint  Place 1 application into both eyes 3 (three) times daily.     BABY SHAMPOO EX  Place 1 drop into both eyes daily. Prior to eye meds.     bisacodyl 10 MG suppository  Commonly known as:  DULCOLAX  Place 10 mg rectally See admin instructions. Every other day As needed for constipation.     cetirizine HCl 5 MG/5ML Syrp  Commonly known as:  Zyrtec  Place 10 mg into feeding tube at bedtime.     clonazePAM 0.1 mg/mL Susp  Commonly known as:  KLONOPIN  Place 0.25-0.5 mg into feeding tube 2 (two) times daily. Take 0.25mg  in the morning and 0.5mg  in the evening     diazepam 10 MG Gel  Commonly known as:  DIASTAT ACUDIAL   Place 10 mg rectally once as needed. For seizure lasting longer than     eucerin cream  Apply 1 application topically as needed. For dry skin.     feeding supplement (JEVITY 1.2 CAL) Liqd  Place 150 mLs into feeding tube 5 (five) times daily.     fluticasone 220 MCG/ACT inhaler  Commonly known as:  FLOVENT HFA  Inhale 3 puffs into the lungs 2 (two) times daily.     glycopyrrolate 1 MG tablet  Commonly known as:  ROBINUL  Place 2 mg into feeding tube 3 (three) times daily as needed. For increased secretions.     Heparin (Porcine) Lock Flush 100 UNIT/ML Soln  Inject 8 mLs into the vein every 14 (fourteen) days.     ibuprofen 400 MG tablet  Commonly known as:  ADVIL,MOTRIN  Place 400 mg into feeding tube every 6 (six) hours as needed. For pain     lactulose 10 GM/15ML solution  Commonly known as:  CHRONULAC  Place 5-10 mLs into feeding tube 3 (three) times daily as needed. For constipation.     levETIRAcetam 100 MG/ML solution  Commonly known as:  KEPPRA  Place 300 mg into feeding tube 2 (two) times daily.     levothyroxine 25 MCG tablet  Commonly known as:  SYNTHROID, LEVOTHROID  Place 75 mcg into feeding tube daily.     lidocaine 2 % jelly  Commonly known as:  XYLOCAINE  Apply 1 application topically as directed. Apply 30 minutes prior to port access     MULTI COMPLETE/IRON PO  Place 1 tablet into feeding tube daily.     mupirocin ointment 2 %  Commonly known as:  BACTROBAN  Apply 1 application topically 2 (two) times daily. Topical to trach and GT stomas     NORMAL SALINE FLUSH IV  Inject 8 mLs into the vein every 14 (fourteen) days.     OVER THE COUNTER MEDICATION  Place 30 mLs into feeding tube See admin instructions. Sodium Bacitrate - Mix with H2O and give with table salt.  * Do not mix in feeding bag     phenytoin 30 MG ER capsule  Commonly known as:  DILANTIN  Place 60-90 mg into feeding tube 3 (three) times daily. Give 60mg  at 0800 and 1600, and  give 90mg  at Bedtime     polyethylene glycol packet  Commonly known as:  MIRALAX / GLYCOLAX  Place 17 g into feeding tube every morning.     simethicone 40 MG/0.6ML drops  Commonly known as:  MYLICON  Take 80 mg by mouth every 6 (six) hours as needed. For gas and / or discomfort     topiramate 100 MG tablet  Commonly known as:  TOPAMAX  Place 100 mg into feeding tube 2 (two) times daily.        Immunizations Given (date): none   Follow Up Issues/Recommendations: Leland is being sent home with 8 days of Ciprofloxacin and should continue this antibiotic until it is done. Will has a follow-up appointment at [facility] [place] on 10/31/12 at 10:15am. It is very important that he go to this appointment as scheduled in order to begin his transition of care to an adult provider.   Pending Results: blood culture and trach culture  Specific instructions to the patient and/or family : Continue to wean back to baseline ventilator support as tolerated.    ROSE, AMANDA M 10/23/2012, 10:43 AM

## 2012-10-23 NOTE — Progress Notes (Signed)
Subjective: Trialed on trach collar, but made poor respiratory effort and subsequently desatted. Placed back on home vent. Few BP in 80s while asleep, exam unchanged, now resolved. Miralax held this AM for 3 large loose stools yesterday.   Objective: Vital signs in last 24 hours: Temp:  [97 F (36.1 C)-98.5 F (36.9 C)] 97.5 F (36.4 C) (03/12 0400) Pulse Rate:  [58-79] 79 (03/12 0700) Resp:  [12-29] 29 (03/12 0700) BP: (75-107)/(35-73) 96/53 mmHg (03/12 0700) SpO2:  [94 %-100 %] 95 % (03/12 0700) FiO2 (%):  [21 %] 21 % (03/12 0700)  Intake/Output from previous day: 03/11 0701 - 03/12 0700 In: 2330 [I.V.:430; IV Piggyback:150] Out: 1922 [Urine:714] 1.26cc/kg/h  Lines, Airways, Drains: R chest Port G-tube Tracheostomy  Physical Exam Gen: Lying quietly in bed, awake but not interactive, in NAD. HEENT: Sclerae injected without drainage, MMM. CV: RRR, no murmur, brisk cap refill, 2+ distal pulses. Resp: Coarse transmitted upper airway noises, good air entry, no wheezes or crackles, no retractions. Abd: GT site c/d/i, soft, NT, ND. Ext: WWP, multiple contractures.  Labs/Studies:  BCx #1 3/9: NGTD BCx #2 3/9: GPCs in clusters (at 36 hours), not speciated yet BCx 3/11: NGTD Tracheal aspirate: Gram stain with rare GPR and rare GPC in clusters; culture reincubated.  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 bacterial tracheitis. Now stable on home vent settings on RA but not tolerating trach collar.  Resp: Resolving acute-on-chronic respiratory failure; tolerating 21% FiO2 but failed trial of trach collar. - Will trial trach collar again this AM. - Continue home vent settings. - Continue albuterol MDI PRN increased WOB, Robinul PRN secretions.  ID: Increased secretions/O2 requirement concerning for tracheitis,  viral vs. bacterial. Prior cultures + Serratia and Moraxella. Trach culture with rare organisms on Gram stain. 1 of 2 initial BCx with GPCs, NGTD on repeat BCx; likely contaminant. - Continue to F/U BCx and trach Cx. - Continue ceftaz pending negative repeat BCx as well as trach Cx results. - Will switch to PO for D/C, likely Cipro, plan for 7 day course for tracheitis.  FEN/GI: Tolerating home tube feeds. Good UOP. - Continue home regimen of bolus feeds 5x daily. - Continue holding home Miralax for loose stools.  Neuro: Static encephalopathy with seizure disorder. No seizures noted in past day. Keppra increased. - Continue home regimen, home Keppra with new dose 400mg  BID.  Access: Port-a-Cath  Social/Dispo: PICU status for acute-on-chronic respiratory failure and possible bacterial tracheitis, likely D/C today on PO antibiotics. - Currently transitioning to adult primary care with [facility]; will schedule F/U. - Will arrange transport home by EMS since on vent. - Will need to resume home health orders at discharge.    LOS: 3 days    ROSE, AMANDA M 10/23/2012, 10:34 AM

## 2012-10-23 NOTE — Plan of Care (Signed)
Problem: Phase III Progression Outcomes Goal: IV meds to PO Outcome: Completed/Met Date Met:  10/23/12 To be sent home on Ciprofloxacin via g-tube.

## 2012-10-23 NOTE — Clinical Social Work Note (Signed)
CSW received consultation for ambulance transfer home. CSW confirmed that Pt's mom is at home ready for Pt to arrive. CSW called [facility] for transport.  RN aware.    Frederico Hamman, LCSW [phone]

## 2012-10-23 NOTE — Progress Notes (Signed)
10/23/12, Kathi Der RNC-MNN, BSN, [phone], Discharge Summary and Resumption of Home Health Services order faxed to [facility] with confirmation.

## 2012-10-23 NOTE — Progress Notes (Signed)
Patient discharged to home via [facility].  Report given to [facility] staff regarding care of [facility].  All remaining open supplies in the room packed and sent home with [name].  Extra trach and inner cannulas sent home with patient.  All personal items, which included bath supplies and shirts (4) sent home with patient.  Patient to be transported to home with his home ventilator intact.  Called Ausar's mother Byrd Hesselbach Addison) and notified her that Toron was on his way home.  We also reviewed his after visit summary, including his follow up appointment and medication changes (addition of Ciprofloxacin and increased dosage of Keppra).  No other changes noted to medications at this time.  Sent home with patient for mother's reference include:  After Visit Summary, Discharge Summary, 24 hour MAR, and home health paperwork that was left with staff upon admission.  Also sent home with patient are prescriptions for the ciprofloxacin and increased dosage of the Keppra.  [facility] to give all information and paperwork to mother.  Per MD orders also sent home 1 dose of Ciprofloxacin 500mg  to be given tonight by mother.  Reviewed administration instructions for Ciprofloxacin with mother.

## 2012-10-23 NOTE — Progress Notes (Signed)
Dr. Mayford Knife to the bedside and notified of patient's periodic bradycardia to the upper 40's (47-49) range.  This will only be very brief, 2-3 seconds at a time and increase back to the 55-65 range.  Patient remains well perfused, good peripheral pulses, cap refill < 3 seconds, warm, and pink.  Also notified at this time of BP = 82/43 in the right arm.  BP immediately reassessed in the left arm to be 101/70.  No new orders received at this time.  Patient is currently asleep and not taking any breaths over the ventilator.  Trach collar trial done by Dr. Mayford Knife.  Not successful due to patient not initiating any spontaneous breaths.  Placed back on ventilator with previous settings.

## 2012-10-24 LAB — CULTURE, RESPIRATORY W GRAM STAIN: Special Requests: NORMAL

## 2012-10-27 LAB — CULTURE, BLOOD (ROUTINE X 2): Culture: NO GROWTH

## 2012-10-28 LAB — CULTURE, BLOOD (SINGLE)

## 2012-11-01 NOTE — Discharge Summary (Signed)
Seen and agree with above note.  Elmon Else. Mayford Knife, MD Pediatric Critical Care 11/01/2012,9:19 AM

## 2012-12-13 ENCOUNTER — Other Ambulatory Visit: Payer: Self-pay | Admitting: Family

## 2012-12-13 DIAGNOSIS — G40319 Generalized idiopathic epilepsy and epileptic syndromes, intractable, without status epilepticus: Secondary | ICD-10-CM

## 2012-12-13 MED ORDER — LEVETIRACETAM 100 MG/ML PO SOLN: ORAL | Status: DC

## 2012-12-30 ENCOUNTER — Encounter: Payer: Self-pay | Admitting: Family

## 2012-12-30 ENCOUNTER — Ambulatory Visit (INDEPENDENT_AMBULATORY_CARE_PROVIDER_SITE_OTHER): Payer: Medicaid Other | Admitting: Family

## 2012-12-30 VITALS — BP 90/60 | HR 90 | Wt 137.0 lb

## 2012-12-30 DIAGNOSIS — Z930 Tracheostomy status: Secondary | ICD-10-CM

## 2012-12-30 DIAGNOSIS — H47619 Cortical blindness, unspecified side of brain: Secondary | ICD-10-CM

## 2012-12-30 DIAGNOSIS — G808 Other cerebral palsy: Secondary | ICD-10-CM

## 2012-12-30 DIAGNOSIS — Z9911 Dependence on respirator [ventilator] status: Secondary | ICD-10-CM

## 2012-12-30 DIAGNOSIS — F72 Severe intellectual disabilities: Secondary | ICD-10-CM | POA: Insufficient documentation

## 2012-12-30 DIAGNOSIS — J9610 Chronic respiratory failure, unspecified whether with hypoxia or hypercapnia: Secondary | ICD-10-CM

## 2012-12-30 DIAGNOSIS — Z931 Gastrostomy status: Secondary | ICD-10-CM

## 2012-12-30 DIAGNOSIS — G40401 Other generalized epilepsy and epileptic syndromes, not intractable, with status epilepticus: Secondary | ICD-10-CM

## 2012-12-30 DIAGNOSIS — R6250 Unspecified lack of expected normal physiological development in childhood: Secondary | ICD-10-CM

## 2012-12-30 DIAGNOSIS — G40319 Generalized idiopathic epilepsy and epileptic syndromes, intractable, without status epilepticus: Secondary | ICD-10-CM

## 2012-12-30 DIAGNOSIS — G40901 Epilepsy, unspecified, not intractable, with status epilepticus: Secondary | ICD-10-CM

## 2012-12-30 DIAGNOSIS — G40909 Epilepsy, unspecified, not intractable, without status epilepticus: Secondary | ICD-10-CM

## 2012-12-30 DIAGNOSIS — G9349 Other encephalopathy: Secondary | ICD-10-CM

## 2012-12-30 DIAGNOSIS — Q079 Congenital malformation of nervous system, unspecified: Secondary | ICD-10-CM

## 2012-12-30 NOTE — Patient Instructions (Signed)
Continue Marion's medications without any changes. Follow up with his primary care provider for his other medical problems.  Let me know if his seizure frequency or intensity increases. Plan to return for follow up in 1 year or sooner if needed.

## 2012-12-30 NOTE — Progress Notes (Signed)
Patient: Jeff Barry MRN: [id number] Sex: male DOB: Feb 24, 1991  Provider: Elveria Rising, NP Location of Care: [facility]  Note type: Routine return visit  History of Present Illness: Referral Source: [facility] History from: Mother Chief Complaint: Epilepsy  Jeff Barry is a 22 y.o. male with profound neurodevelopmental disorder of unknown etiology. 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 He is on long term mechanical ventilation. He has been neurologically stable for several years. He has had problems with recurrent pneumonia. He was hospitalized in March 2014 for probable aspiration pneumonia and seizures. His Keppra dose was increased during that hospitalization. He continues to have intermittent brief seizures several times per week. He has two tiny areas of skin breakdown on his left ear and on his posterior neck underneath his tracheostomy ties. His mother and caregivers are diligent with skin care and positioning.  Review of Systems: 12 system review was remarkable for snoring, anemia, feeling cold, flushing, allergies, seizure and tremor.  Past Medical History  Diagnosis Date  . Seizures   . Encephalopathy   . Ventilator dependent    Hospitalizations: yes, Head Injury: no, Nervous System Infections: no, Immunizations up to date: yes Past Medical History Comments: Patient was hospitalized in March 2014 for probable aspiration pneumonia and seizures  Surgical History Past Surgical History  Procedure Laterality Date  . Tracheostomy    . Gastrostomy     Surgeries: yes Surgical History Comments: Trach in 1999, feeding tube placement in 1999 and [employer] surgery in 1999.  Family History family history includes Colon cancer in his maternal grandmother and Emphysema in his maternal grandfather. Family History is negative migraines, seizures, cognitive  impairment, blindness, deafness, birth defects, chromosomal disorder, autism.  Social History History   Social History  . Marital Status: Single    Spouse Name: N/A    Number of Children: N/A  . Years of Education: N/A   Social History Main Topics  . Smoking status: Never Smoker   . Smokeless tobacco: None  . Alcohol Use: No  . Drug Use: No  . Sexually Active: No   Other Topics Concern  . None   Social History Narrative  . None   Educational level N/A Attending: N/A  Occupation: N/A Living with mother step father and 3 younger sisters.    Allergies  Allergen Reactions  . Reglan (Metoclopramide Hcl) Other (See Comments)    Patient will have seizures if he takes Reglan.    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 He has a small head and large face. He is seated in a wheelchair. He has a tracheostomy and is on long term mechanical ventilation. Head: head  microcephalic. Ears, Nose and Throat: Tracheostomy intact. He will not open his mouth for me to view his pharynx. Neck: somewhat rigid tone with no carotid or supraclavicular bruits. Respiratory: Lungs clear to auscultation Cardiovascular: regular rate and rhythm, no murmurs Musculoskeletal: Significant flexion contractures at the shoulders, elbows, wrists, knees, hips and ankles. Has generalized increased muscle tone with wasting of the extremities. Skin: no rashes. He has a tiny area of skin breakdown on his left ear auricle and another on his posterior neck beneath his tracheostomy ties.  Trunk: soft abdomen with bowel sounds in all 4 quadrants.  Neurologic Exam  Mental Status: Awake. He has evidence of severe mental retardation. He has  no language. He does not respond to the examiner, his mother or caregiver. His mother says that he occasionally smiles at home. Cranial Nerves: Fundoscopic exam reviews positive red reflex on the right, and an opacity  on the left that may be a cataract. It is difficult to assess due to patient's inability to cooperate. He does not fix or follow. He does not respond to auditory stimuli. I saw no movement in his facial muscles.  Motor: Spastic rigid quadriparesis. Lack of fine motor skills. Sensory: Very minimal withdrawal x4. Coordination: Unable to assess to patient's inability to cooperate. Gait and Station: Wheelchair bound. Reflexes: Absent reflexes.  Assessment and Plan Jeff Barry is a 22 year old young man with profound neurodevelopmental disorder of unknown etiology. He requires care in all aspects of daily living and is completely dependent upon others. He is stable at this time. I will see him back in follow up in 1 year or sooner if needed.

## 2013-01-04 ENCOUNTER — Other Ambulatory Visit: Payer: Self-pay | Admitting: Pediatrics

## 2013-01-09 ENCOUNTER — Other Ambulatory Visit: Payer: Self-pay | Admitting: Family

## 2013-01-20 ENCOUNTER — Other Ambulatory Visit: Payer: Self-pay | Admitting: Pediatrics

## 2013-01-27 ENCOUNTER — Other Ambulatory Visit: Payer: Self-pay | Admitting: Pediatrics

## 2013-01-30 ENCOUNTER — Telehealth: Payer: Self-pay

## 2013-01-30 DIAGNOSIS — G40319 Generalized idiopathic epilepsy and epileptic syndromes, intractable, without status epilepticus: Secondary | ICD-10-CM

## 2013-01-30 MED ORDER — DIASTAT ACUDIAL 10 MG RE GEL: RECTAL | Status: AC

## 2013-01-30 NOTE — Telephone Encounter (Signed)
This encounter was created in error - please disregard.

## 2013-01-30 NOTE — Telephone Encounter (Signed)
I called and talked to Mom. She said that Jeff Barry had been having more seizures this week. He has not had labs checked since January, so I recommended a lab draw. Mom agreed so I called orders to [facility]. They will draw blood in AM. TG

## 2013-01-30 NOTE — Telephone Encounter (Signed)
Jeff Barry asking for refills and also said that pt has been having more frequent seizures. I called mom and she said that yesterday he had so many seizures that they lost count. Mom said that he has been having seizures today as well. She said some of the seizures his body tenses up and he gets red in the face. She said some seizures he just stares off. He has not been sick recently and has not missed any doses of his medication. Please call mom at [phone].

## 2013-02-02 ENCOUNTER — Other Ambulatory Visit: Payer: Self-pay | Admitting: Pediatrics

## 2013-02-05 ENCOUNTER — Telehealth: Payer: Self-pay | Admitting: Pediatrics

## 2013-02-05 NOTE — Telephone Encounter (Addendum)
Laboratory study on Jeff Barry from February 03, 2013.  White blood cell count 3900, hemoglobin 13.0, hematocrit 37.6, MCV 75.2, platelet count 135,000.  Phenytoin 20.3 mcg/mL, topiramate 2.7 mcg/mL.

## 2013-02-06 ENCOUNTER — Other Ambulatory Visit: Payer: Self-pay | Admitting: Family

## 2013-02-06 DIAGNOSIS — G40319 Generalized idiopathic epilepsy and epileptic syndromes, intractable, without status epilepticus: Secondary | ICD-10-CM

## 2013-02-06 DIAGNOSIS — R569 Unspecified convulsions: Secondary | ICD-10-CM

## 2013-02-13 ENCOUNTER — Telehealth: Payer: Self-pay | Admitting: Family

## 2013-02-13 NOTE — Telephone Encounter (Signed)
Vida Roller, NP at [facility] called to verify Jeff Barry's Levetiracetam dose. I checked back in hospital record and the dose was increased to 4 ml BID at last hospitalization. I gave Jeff Barry that information and updated his chart. TG

## 2013-02-21 NOTE — Telephone Encounter (Signed)
I spoke with mother about the laboratories.  The patient continues to have seizures and she asked whether or not we should increase his medications.  The seizures are not typically frequent and don't last a long time nor do they significantly affect him after the fact.  I would not make changes at this time.  She agreed with this plan.

## 2013-02-25 ENCOUNTER — Other Ambulatory Visit: Payer: Self-pay | Admitting: Family

## 2013-02-25 NOTE — Telephone Encounter (Signed)
Nedra Hai, pharmacist at [facility] called to clarify instructions on recent refill sent in. The Clonazepam Rx should be Compounded to 0.1mg /ml give 2.28ml AM and 5ml PM. I approved this instruction and removed incorrect one from Epic. TG

## 2013-02-25 NOTE — Addendum Note (Signed)
Addended by: Princella Ion on: 02/25/2013 02:00 PM   Modules accepted: Orders

## 2013-03-10 ENCOUNTER — Other Ambulatory Visit: Payer: Self-pay | Admitting: Pediatrics

## 2013-07-21 ENCOUNTER — Other Ambulatory Visit: Payer: Self-pay | Admitting: Pediatrics

## 2013-08-04 ENCOUNTER — Other Ambulatory Visit: Payer: Self-pay | Admitting: Family

## 2013-09-03 ENCOUNTER — Other Ambulatory Visit: Payer: Self-pay | Admitting: Family

## 2013-09-03 ENCOUNTER — Telehealth: Payer: Self-pay

## 2013-09-03 NOTE — Telephone Encounter (Signed)
Tresa EndoKelly, [facility] called needing clarification on the concentration for patients clonazepam. Please advise and I will call her back at [phone].

## 2013-09-03 NOTE — Telephone Encounter (Signed)
I called and spoke with Tresa EndoKelly. Crawford's Clonazepam concentration is 0.1mg /ml and he receives 2.465ml in the morning and 5ml at night. She said that they may use Clonazepam 1 or 2 mg tablets to compound this medication. TG

## 2013-09-22 ENCOUNTER — Other Ambulatory Visit: Payer: Self-pay | Admitting: Family

## 2015-08-15 DEATH — deceased
